# Patient Record
Sex: Male | Born: 1960 | Race: White | Hispanic: No | Smoking: Never smoker
Health system: Southern US, Community
[De-identification: ages and names within clinical notes are randomized; demographics above are authoritative.]

## PROBLEM LIST (undated history)

## (undated) DIAGNOSIS — T7840XA Allergy, unspecified, initial encounter: Secondary | ICD-10-CM

## (undated) DIAGNOSIS — R569 Unspecified convulsions: Secondary | ICD-10-CM

## (undated) DIAGNOSIS — I10 Essential (primary) hypertension: Secondary | ICD-10-CM

## (undated) DIAGNOSIS — J45909 Unspecified asthma, uncomplicated: Secondary | ICD-10-CM

## (undated) HISTORY — DX: Unspecified asthma, uncomplicated: J45.909

## (undated) HISTORY — DX: Allergy, unspecified, initial encounter: T78.40XA

## (undated) HISTORY — DX: Essential (primary) hypertension: I10

## (undated) HISTORY — PX: ANKLE ARTHROCENTESIS: SUR45

---

## 2000-01-21 ENCOUNTER — Encounter: Admission: RE | Admit: 2000-01-21 | Discharge: 2000-02-20 | Payer: Self-pay | Admitting: Orthopedic Surgery

## 2005-01-31 ENCOUNTER — Emergency Department (HOSPITAL_COMMUNITY): Admission: EM | Admit: 2005-01-31 | Discharge: 2005-01-31 | Payer: Self-pay | Admitting: Emergency Medicine

## 2005-03-03 ENCOUNTER — Emergency Department (HOSPITAL_COMMUNITY): Admission: EM | Admit: 2005-03-03 | Discharge: 2005-03-03 | Payer: Self-pay | Admitting: Family Medicine

## 2009-08-03 ENCOUNTER — Emergency Department: Payer: Self-pay | Admitting: Emergency Medicine

## 2012-07-02 ENCOUNTER — Emergency Department: Payer: Self-pay | Admitting: Emergency Medicine

## 2012-07-02 LAB — CBC WITH DIFFERENTIAL/PLATELET
Eosinophil %: 4.9 %
HCT: 49.1 % (ref 40.0–52.0)
HGB: 16.4 g/dL (ref 13.0–18.0)
Lymphocyte #: 1.4 10*3/uL (ref 1.0–3.6)
MCV: 92 fL (ref 80–100)
Monocyte %: 7.2 %
Platelet: 280 10*3/uL (ref 150–440)
RBC: 5.34 10*6/uL (ref 4.40–5.90)
WBC: 10.2 10*3/uL (ref 3.8–10.6)

## 2012-07-02 LAB — COMPREHENSIVE METABOLIC PANEL
Albumin: 3.6 g/dL (ref 3.4–5.0)
Alkaline Phosphatase: 80 U/L (ref 50–136)
Anion Gap: 8 (ref 7–16)
BUN: 8 mg/dL (ref 7–18)
Glucose: 120 mg/dL — ABNORMAL HIGH (ref 65–99)
Osmolality: 281 (ref 275–301)
Potassium: 3.9 mmol/L (ref 3.5–5.1)
Sodium: 141 mmol/L (ref 136–145)
Total Protein: 7.5 g/dL (ref 6.4–8.2)

## 2012-07-02 LAB — RAPID INFLUENZA A&B ANTIGENS

## 2016-12-23 ENCOUNTER — Ambulatory Visit: Payer: Self-pay | Admitting: Family Medicine

## 2016-12-23 VITALS — BP 124/80 | HR 80 | Wt 195.6 lb

## 2016-12-23 DIAGNOSIS — I10 Essential (primary) hypertension: Secondary | ICD-10-CM

## 2016-12-23 DIAGNOSIS — J301 Allergic rhinitis due to pollen: Secondary | ICD-10-CM

## 2016-12-23 DIAGNOSIS — J4540 Moderate persistent asthma, uncomplicated: Secondary | ICD-10-CM

## 2016-12-23 MED ORDER — BUDESONIDE-FORMOTEROL FUMARATE 160-4.5 MCG/ACT IN AERO: 2.0000 | INHALATION_SPRAY | Freq: Two times a day (BID) | RESPIRATORY_TRACT | 3 refills | Status: DC

## 2016-12-23 MED ORDER — ALBUTEROL SULFATE HFA 108 (90 BASE) MCG/ACT IN AERS: 2.0000 | INHALATION_SPRAY | Freq: Four times a day (QID) | RESPIRATORY_TRACT | 5 refills | Status: DC | PRN

## 2016-12-23 MED ORDER — HYDROCHLOROTHIAZIDE 25 MG PO TABS: 25.0000 mg | ORAL_TABLET | Freq: Every day | ORAL | 4 refills | Status: DC

## 2016-12-23 MED ORDER — LISINOPRIL 20 MG PO TABS: 20.0000 mg | ORAL_TABLET | Freq: Every day | ORAL | 2 refills | Status: DC

## 2016-12-23 MED ORDER — MONTELUKAST SODIUM 10 MG PO TABS: 10.0000 mg | ORAL_TABLET | Freq: Every day | ORAL | 3 refills | Status: DC

## 2016-12-23 NOTE — Progress Notes (Signed)
Primary Care Progress Note  Patient: Tony Calderon Male    DOB: May 04, 1961   56 y.o.   MRN: [id number] Visit Date: 12/23/2016  Today's Provider: Mila Merry, MD  Chief Complaint  Patient presents with  . Asthma    would like to start Symbicort again  . Hypertension  . Gastroesophageal Reflux   Subjective:    HPI  Patient is here to establish today. Recently released after being incarcerated for 1 1/2 years. Seeking employment. Long history of asthma since childhood and usually on inhalers. Had previously been on Symbicort. Tried QVar and Azmanex.   Also with history htn on lisinopril and hctz for several years. He reports he had incident of heart racing when he was first incarcerated and had complete cardiac workup at [facility] which he would like to follow up on.     No Known Allergies Previous Medications   ALBUTEROL (PROVENTIL HFA;VENTOLIN HFA) 108 (90 BASE) MCG/ACT INHALER    Inhale 2 puffs into the lungs every 6 (six) hours as needed for wheezing or shortness of breath.   DIPHENHYDRAMINE (BENADRYL) 25 MG CAPSULE    Take 25 mg by mouth once.   HYDROCHLOROTHIAZIDE (HYDRODIURIL) 25 MG TABLET    Take 25 mg by mouth daily.   LISINOPRIL (PRINIVIL,ZESTRIL) 20 MG TABLET    Take 20 mg by mouth daily.   LORATADINE (CLARITIN) 10 MG TABLET    Take 10 mg by mouth daily.    Review of Systems  Social History  Substance Use Topics  . Smoking status: Never Smoker  . Smokeless tobacco: Not on file  . Alcohol use Yes     Comment: occassionally   Objective:   BP 124/80   Pulse 80   Wt 195 lb 9.6 oz (88.7 kg)   Physical Exam  . General Appearance:    Alert, cooperative, no distress  Eyes:    PERRL, conjunctiva/corneas clear, EOM's intact       Lungs:     Clear to auscultation bilaterally, respirations unlabored  Heart:    Regular rate and rhythm  Neurologic:   Awake, alert, oriented x 3. No apparent focal neurological           defect.           Assessment & Plan:     1.  Moderate persistent asthma without complication Had previously been well controlled on Symbicort which was restarted - budesonide-formoterol (SYMBICORT) 160-4.5 MCG/ACT inhaler; Inhale 2 puffs into the lungs 2 (two) times daily.  Dispense: 3 Inhaler; Refill: 3 - montelukast (SINGULAIR) 10 MG tablet; Take 1 tablet (10 mg total) by mouth at bedtime.  Dispense: 90 tablet; Refill: 3 - albuterol (PROVENTIL HFA;VENTOLIN HFA) 108 (90 Base) MCG/ACT inhaler; Inhale 2 puffs into the lungs every 6 (six) hours as needed for wheezing or shortness of breath.  Dispense: 3 Inhaler; Refill: 5  2. Hypertension, unspecified type Controlled for prolonged period on  - lisinopril (PRINIVIL,ZESTRIL) 20 MG tablet; Take 1 tablet (20 mg total) by mouth daily.  Dispense: 90 tablet; Refill: 2 - hydrochlorothiazide (HYDRODIURIL) 25 MG tablet; Take 1 tablet (25 mg total) by mouth daily.  Dispense: 90 tablet; Refill: 4  3. Allergic rhinitis due to pollen, unspecified seasonality Previous taken Singulair which he states was effective.  - montelukast (SINGULAIR) 10 MG tablet; Take 1 tablet (10 mg total) by mouth at bedtime.  Dispense: 90 tablet; Refill: 3  Send for records from [facility] regarding previous cardiac work up.  Return in about 3 months (around 03/25/2017).       Mila Merryonald Julieann Drummonds, MD

## 2016-12-26 ENCOUNTER — Ambulatory Visit: Payer: Self-pay | Admitting: Pharmacy Technician

## 2016-12-26 DIAGNOSIS — Z79899 Other long term (current) drug therapy: Secondary | ICD-10-CM

## 2016-12-26 NOTE — Progress Notes (Signed)
  Completed contract.  Patient agreed to all terms of the [facility] contract.  Patient to provide notarized letter of support.   Provided patient with Community education officercommunity resource material based on his particular needs.    Symbicort & Ventolin Prescription Applications completed with patient.  Forwarded to [facility] for signature.  Upon receipt of signed applications from provider and proof of income from patient, Ventolin Prescription Application will be submitted to [employer] and Symbicort Prescription Application will be submitted to [place].  Sherilyn DacostaBetty J. Vaun Hyndman Care Manager [facility]

## 2017-01-03 ENCOUNTER — Emergency Department: Payer: Self-pay

## 2017-01-03 ENCOUNTER — Encounter: Payer: Self-pay | Admitting: Internal Medicine

## 2017-01-03 ENCOUNTER — Inpatient Hospital Stay
Admission: EM | Admit: 2017-01-03 | Discharge: 2017-01-04 | DRG: 193 | Disposition: A | Discharge status: Home / Self Care | Payer: Self-pay | Attending: Internal Medicine | Admitting: Internal Medicine

## 2017-01-03 DIAGNOSIS — J181 Lobar pneumonia, unspecified organism: Secondary | ICD-10-CM

## 2017-01-03 DIAGNOSIS — J189 Pneumonia, unspecified organism: Principal | ICD-10-CM | POA: Diagnosis present

## 2017-01-03 DIAGNOSIS — J440 Chronic obstructive pulmonary disease with acute lower respiratory infection: Secondary | ICD-10-CM | POA: Diagnosis present

## 2017-01-03 DIAGNOSIS — J40 Bronchitis, not specified as acute or chronic: Secondary | ICD-10-CM

## 2017-01-03 DIAGNOSIS — J9601 Acute respiratory failure with hypoxia: Secondary | ICD-10-CM | POA: Diagnosis present

## 2017-01-03 DIAGNOSIS — Z7952 Long term (current) use of systemic steroids: Secondary | ICD-10-CM

## 2017-01-03 DIAGNOSIS — I10 Essential (primary) hypertension: Secondary | ICD-10-CM | POA: Diagnosis present

## 2017-01-03 DIAGNOSIS — J441 Chronic obstructive pulmonary disease with (acute) exacerbation: Secondary | ICD-10-CM | POA: Diagnosis present

## 2017-01-03 DIAGNOSIS — J683 Other acute and subacute respiratory conditions due to chemicals, gases, fumes and vapors: Secondary | ICD-10-CM

## 2017-01-03 DIAGNOSIS — E876 Hypokalemia: Secondary | ICD-10-CM | POA: Diagnosis present

## 2017-01-03 DIAGNOSIS — Z7951 Long term (current) use of inhaled steroids: Secondary | ICD-10-CM

## 2017-01-03 DIAGNOSIS — J45901 Unspecified asthma with (acute) exacerbation: Secondary | ICD-10-CM | POA: Diagnosis present

## 2017-01-03 DIAGNOSIS — Z590 Homelessness: Secondary | ICD-10-CM

## 2017-01-03 LAB — CBC WITH DIFFERENTIAL/PLATELET
BASOS PCT: 0 %
Basophils Absolute: 0 10*3/uL (ref 0–0.1)
Eosinophils Absolute: 0.4 10*3/uL (ref 0–0.7)
Eosinophils Relative: 4 %
HEMATOCRIT: 38.3 % — AB (ref 40.0–52.0)
Hemoglobin: 13.2 g/dL (ref 13.0–18.0)
Lymphocytes Relative: 17 %
Lymphs Abs: 1.8 10*3/uL (ref 1.0–3.6)
MCH: 31.4 pg (ref 26.0–34.0)
MCHC: 34.5 g/dL (ref 32.0–36.0)
MCV: 91 fL (ref 80.0–100.0)
MONO ABS: 1 10*3/uL (ref 0.2–1.0)
MONOS PCT: 10 %
NEUTROS ABS: 7.4 10*3/uL — AB (ref 1.4–6.5)
Neutrophils Relative %: 69 %
Platelets: 275 10*3/uL (ref 150–440)
RBC: 4.21 MIL/uL — ABNORMAL LOW (ref 4.40–5.90)
RDW: 14.9 % — AB (ref 11.5–14.5)
WBC: 10.6 10*3/uL (ref 3.8–10.6)

## 2017-01-03 LAB — COMPREHENSIVE METABOLIC PANEL
ALT: 22 U/L (ref 17–63)
AST: 33 U/L (ref 15–41)
Albumin: 4 g/dL (ref 3.5–5.0)
Alkaline Phosphatase: 59 U/L (ref 38–126)
Anion gap: 7 (ref 5–15)
BILIRUBIN TOTAL: 0.4 mg/dL (ref 0.3–1.2)
BUN: 10 mg/dL (ref 6–20)
CHLORIDE: 94 mmol/L — AB (ref 101–111)
CO2: 32 mmol/L (ref 22–32)
CREATININE: 0.97 mg/dL (ref 0.61–1.24)
Calcium: 8.4 mg/dL — ABNORMAL LOW (ref 8.9–10.3)
GFR calc Af Amer: >60 mL/min (ref >60)
Glucose, Bld: 116 mg/dL — ABNORMAL HIGH (ref 65–99)
Potassium: 3.2 mmol/L — ABNORMAL LOW (ref 3.5–5.1)
Sodium: 133 mmol/L — ABNORMAL LOW (ref 135–145)
TOTAL PROTEIN: 7.5 g/dL (ref 6.5–8.1)

## 2017-01-03 LAB — MRSA PCR SCREENING: MRSA by PCR: NEGATIVE

## 2017-01-03 LAB — TROPONIN I: Troponin I: <0.03 ng/mL (ref <0.03)

## 2017-01-03 LAB — BRAIN NATRIURETIC PEPTIDE: B NATRIURETIC PEPTIDE 5: 15 pg/mL (ref 0.0–100.0)

## 2017-01-03 MED ORDER — LEVOFLOXACIN IN D5W 750 MG/150ML IV SOLN: 750.0000 mg | Freq: Once | INTRAVENOUS | Status: DC

## 2017-01-03 MED ORDER — ALBUTEROL SULFATE (2.5 MG/3ML) 0.083% IN NEBU
5.0000 mg | INHALATION_SOLUTION | Freq: Once | RESPIRATORY_TRACT | Status: AC
  Administered 2017-01-03: 5 mg via RESPIRATORY_TRACT
  Filled 2017-01-03: qty 6

## 2017-01-03 MED ORDER — GUAIFENESIN-DM 100-10 MG/5ML PO SYRP
5.0000 mL | ORAL_SOLUTION | ORAL | Status: DC | PRN
  Administered 2017-01-03 – 2017-01-04 (×2): 5 mL via ORAL
  Filled 2017-01-03 (×2): qty 5

## 2017-01-03 MED ORDER — LEVOFLOXACIN IN D5W 750 MG/150ML IV SOLN: 750.0000 mg | INTRAVENOUS | Status: DC

## 2017-01-03 MED ORDER — ACETAMINOPHEN 650 MG RE SUPP: 650.0000 mg | Freq: Four times a day (QID) | RECTAL | Status: DC | PRN

## 2017-01-03 MED ORDER — ALBUTEROL SULFATE (2.5 MG/3ML) 0.083% IN NEBU
2.5000 mg | INHALATION_SOLUTION | RESPIRATORY_TRACT | Status: DC | PRN
  Administered 2017-01-03: 2.5 mg via RESPIRATORY_TRACT
  Filled 2017-01-03: qty 3

## 2017-01-03 MED ORDER — CEFTRIAXONE SODIUM IN DEXTROSE 20 MG/ML IV SOLN: 1.0000 g | Freq: Once | INTRAVENOUS | Status: DC

## 2017-01-03 MED ORDER — IPRATROPIUM-ALBUTEROL 0.5-2.5 (3) MG/3ML IN SOLN
3.0000 mL | RESPIRATORY_TRACT | Status: DC
  Administered 2017-01-03 – 2017-01-04 (×6): 3 mL via RESPIRATORY_TRACT
  Filled 2017-01-03 (×6): qty 3

## 2017-01-03 MED ORDER — ONDANSETRON HCL 4 MG/2ML IJ SOLN: 4.0000 mg | Freq: Four times a day (QID) | INTRAMUSCULAR | Status: DC | PRN

## 2017-01-03 MED ORDER — IPRATROPIUM-ALBUTEROL 0.5-2.5 (3) MG/3ML IN SOLN
3.0000 mL | Freq: Once | RESPIRATORY_TRACT | Status: AC
  Administered 2017-01-03: 3 mL via RESPIRATORY_TRACT
  Filled 2017-01-03: qty 3

## 2017-01-03 MED ORDER — AZITHROMYCIN 250 MG PO TABS: ORAL_TABLET | ORAL | 0 refills | Status: AC

## 2017-01-03 MED ORDER — LEVOFLOXACIN IN D5W 750 MG/150ML IV SOLN
750.0000 mg | INTRAVENOUS | Status: DC
  Administered 2017-01-03: 750 mg via INTRAVENOUS
  Filled 2017-01-03 (×2): qty 150

## 2017-01-03 MED ORDER — METHYLPREDNISOLONE SODIUM SUCC 125 MG IJ SOLR
125.0000 mg | Freq: Once | INTRAMUSCULAR | Status: AC
  Administered 2017-01-03: 125 mg via INTRAVENOUS
  Filled 2017-01-03: qty 2

## 2017-01-03 MED ORDER — POTASSIUM CHLORIDE CRYS ER 20 MEQ PO TBCR
40.0000 meq | EXTENDED_RELEASE_TABLET | Freq: Once | ORAL | Status: AC
  Administered 2017-01-03: 40 meq via ORAL
  Filled 2017-01-03: qty 2

## 2017-01-03 MED ORDER — MONTELUKAST SODIUM 10 MG PO TABS
10.0000 mg | ORAL_TABLET | Freq: Every day | ORAL | Status: DC
  Administered 2017-01-03: 21:00:00 10 mg via ORAL
  Filled 2017-01-03: qty 1

## 2017-01-03 MED ORDER — ACETAMINOPHEN 325 MG PO TABS: 650.0000 mg | ORAL_TABLET | Freq: Four times a day (QID) | ORAL | Status: DC | PRN

## 2017-01-03 MED ORDER — POLYETHYLENE GLYCOL 3350 17 G PO PACK: 17.0000 g | PACK | Freq: Every day | ORAL | Status: DC | PRN

## 2017-01-03 MED ORDER — METHYLPREDNISOLONE SODIUM SUCC 125 MG IJ SOLR
60.0000 mg | Freq: Two times a day (BID) | INTRAMUSCULAR | Status: DC
  Administered 2017-01-03 – 2017-01-04 (×2): 60 mg via INTRAVENOUS
  Filled 2017-01-03 (×2): qty 2

## 2017-01-03 MED ORDER — MOMETASONE FURO-FORMOTEROL FUM 100-5 MCG/ACT IN AERO
2.0000 | INHALATION_SPRAY | Freq: Two times a day (BID) | RESPIRATORY_TRACT | Status: DC
  Administered 2017-01-03 – 2017-01-04 (×3): 2 via RESPIRATORY_TRACT
  Filled 2017-01-03: qty 8.8

## 2017-01-03 MED ORDER — DEXTROSE 5 % IV SOLN: 500.0000 mg | Freq: Once | INTRAVENOUS | Status: DC

## 2017-01-03 MED ORDER — HYDROCHLOROTHIAZIDE 25 MG PO TABS
25.0000 mg | ORAL_TABLET | Freq: Every day | ORAL | Status: DC
  Administered 2017-01-03 – 2017-01-04 (×2): 25 mg via ORAL
  Filled 2017-01-03 (×2): qty 1

## 2017-01-03 MED ORDER — ONDANSETRON HCL 4 MG PO TABS: 4.0000 mg | ORAL_TABLET | Freq: Four times a day (QID) | ORAL | Status: DC | PRN

## 2017-01-03 MED ORDER — LISINOPRIL 20 MG PO TABS
20.0000 mg | ORAL_TABLET | Freq: Every day | ORAL | Status: DC
  Administered 2017-01-03 – 2017-01-04 (×2): 20 mg via ORAL
  Filled 2017-01-03 (×2): qty 1

## 2017-01-03 MED ORDER — PREDNISONE 20 MG PO TABS
20.0000 mg | ORAL_TABLET | Freq: Every day | ORAL | 0 refills | Status: AC
Start: 2017-01-03 — End: 2018-01-03

## 2017-01-03 MED ORDER — ALBUTEROL SULFATE HFA 108 (90 BASE) MCG/ACT IN AERS: 2.0000 | INHALATION_SPRAY | Freq: Four times a day (QID) | RESPIRATORY_TRACT | 2 refills | Status: DC | PRN

## 2017-01-03 MED ORDER — ENOXAPARIN SODIUM 40 MG/0.4ML ~~LOC~~ SOLN
40.0000 mg | SUBCUTANEOUS | Status: DC
  Filled 2017-01-03: qty 0.4

## 2017-01-03 MED ORDER — IBUPROFEN 400 MG PO TABS: 400.0000 mg | ORAL_TABLET | Freq: Four times a day (QID) | ORAL | Status: DC | PRN

## 2017-01-03 NOTE — ED Triage Notes (Signed)
Pt reports increased shortness of breath and orthopnea x 2 weeks. Pt has hx of asthma. Pt presents w/ c/o dyspnea, has audible wheezing and labored breathing at rest.

## 2017-01-03 NOTE — ED Provider Notes (Addendum)
[facility] Emergency Department Provider Note   ____________________________________________   First MD Initiated Contact with Patient 01/03/17 0507     (approximate)  I have reviewed the triage vital signs and the nursing notes.   HISTORY  Chief Complaint Asthma and Wheezing   HPI Tony Calderon is a 56 y.o. male who reportedly is homeless. He's been coughing and short of breath for about 2 weeks. May have had a fever he's not sure. He is coughing up black material. He says he has been having trouble laying down flat because he gets more short of breath.  Past Medical History:  Diagnosis Date  . Allergy   . Asthma   . Hypertension     There are no active problems to display for this patient.   Past Surgical History:  Procedure Laterality Date  . ANKLE ARTHROCENTESIS Right     Prior to Admission medications   Medication Sig Start Date End Date Taking? Authorizing Provider  albuterol (PROVENTIL HFA;VENTOLIN HFA) 108 (90 Base) MCG/ACT inhaler Inhale 2 puffs into the lungs every 6 (six) hours as needed for wheezing or shortness of breath. 12/23/16   Malva LimesFisher, Donald E, MD  albuterol (PROVENTIL HFA;VENTOLIN HFA) 108 (90 Base) MCG/ACT inhaler Inhale 2 puffs into the lungs every 6 (six) hours as needed for wheezing or shortness of breath. 01/03/17   Arnaldo NatalMalinda, Deliana Avalos F, MD  azithromycin (ZITHROMAX Z-PAK) 250 MG tablet Take 2 tablets (500 mg) on  Day 1,  followed by 1 tablet (250 mg) once daily on Days 2 through 5. 01/03/17 01/08/17  Arnaldo NatalMalinda, Kriss Ishler F, MD  budesonide-formoterol [facility](SYMBICORT) 160-4.5 MCG/ACT inhaler Inhale 2 puffs into the lungs 2 (two) times daily. 12/23/16   Malva LimesFisher, Donald E, MD  diphenhydrAMINE (BENADRYL) 25 mg capsule Take 25 mg by mouth once.    [provider]  hydrochlorothiazide (HYDRODIURIL) 25 MG tablet Take 1 tablet (25 mg total) by mouth daily. 12/23/16   Malva LimesFisher, Donald E, MD  lisinopril (PRINIVIL,ZESTRIL) 20 MG tablet Take 1  tablet (20 mg total) by mouth daily. 12/23/16   Malva LimesFisher, Donald E, MD  loratadine (CLARITIN) 10 MG tablet Take 10 mg by mouth daily.    [provider]  montelukast (SINGULAIR) 10 MG tablet Take 1 tablet (10 mg total) by mouth at bedtime. 12/23/16   Malva LimesFisher, Donald E, MD  predniSONE (DELTASONE) 20 MG tablet Take 1 tablet (20 mg total) by mouth daily. 01/03/17 01/03/18  Arnaldo NatalMalinda, Jary Louvier F, MD    Allergies Patient has no known allergies.  Family History  Problem Relation Age of Onset  . Cancer Mother   . Hypertension Father   . Heart disease Father     Social History Social History  Substance Use Topics  . Smoking status: Never Smoker  . Smokeless tobacco: Not on file  . Alcohol use Yes     Comment: occassionally    Review of Systems  Constitutional: Possible fever/chills Eyes: No visual changes. ENT: No sore throat. Cardiovascular: Denies chest pain. Respiratory:  shortness of breath. Gastrointestinal: No abdominal pain.  No nausea, no vomiting.  No diarrhea.  No constipation. Genitourinary: Negative for dysuria. Musculoskeletal: Negative for back pain. Skin: Negative for rash. Neurological: Negative for headaches, focal weakness or numbness. \ ____________________________________________   PHYSICAL EXAM:  VITAL SIGNS: ED Triage Vitals  Enc Vitals Group     BP 01/03/17 0448 105/61     Pulse Rate 01/03/17 0448 96     Resp 01/03/17 0448 (!)  28     Temp 01/03/17 0448 98.9 F (37.2 C)     Temp Source 01/03/17 0448 Oral     SpO2 01/03/17 0448 92 %     Weight 01/03/17 0449 195 lb (88.5 kg)     Height 01/03/17 0449 5\' 9"  (1.753 m)     Head Circumference --      Peak Flow --      Pain Score 01/03/17 0446 8     Pain Loc --      Pain Edu? --      Excl. in GC? --     Constitutional: Alert and oriented. Well appearing and in no acute distress. Eyes: Conjunctivae are normal. PERRL. EOMI. Head: Atraumatic. Nose: No congestion/rhinnorhea. Mouth/Throat: Mucous  membranes are moist.  Oropharynx non-erythematous. Neck: No stridor. Cardiovascular: Normal rate, regular rhythm. Grossly normal heart sounds.  Good peripheral circulation. Respiratory: Normal respiratory effort.  No retractions. Lungs wheezes and crackles diffusely Gastrointestinal: Soft and nontender. No distention. No abdominal bruits. No CVA tenderness. Musculoskeletal: No lower extremity tenderness nor edema.  No joint effusions. Neurologic:  Normal speech and language. No gross focal neurologic deficits are appreciated.  Skin:  Skin is warm, dry and intact. No rash noted. Psychiatric: Mood and affect are normal. Speech and behavior are normal.  ____________________________________________   LABS (all labs ordered are listed, but only abnormal results are displayed)  Labs Reviewed  COMPREHENSIVE METABOLIC PANEL - Abnormal; Notable for the following:       Result Value   Sodium 133 (*)    Potassium 3.2 (*)    Chloride 94 (*)    Glucose, Bld 116 (*)    Calcium 8.4 (*)    All other components within normal limits  CBC WITH DIFFERENTIAL/PLATELET - Abnormal; Notable for the following:    RBC 4.21 (*)    HCT 38.3 (*)    RDW 14.9 (*)    Neutro Abs 7.4 (*)    All other components within normal limits  BRAIN NATRIURETIC PEPTIDE  TROPONIN I   ____________________________________________  EKG EKG read and interpreted by me shows normal sinus rhythm rate of 94 normal axis left bundle-branch block no apparent acute ST-T wave changes bundle-branch block is new since November 2013 ____________________________________________  RADIOLOGY  IMPRESSION: Peribronchial thickening. Minimal right basilar airspace opacity may reflect atelectasis or possibly mild infection.   Electronically Signed   By: Roanna Raider M.D.   On: 01/03/2017 05:59 ____________________________________________   PROCEDURES  Procedure(s) performed:  Procedures  Critical Care performed:    ____________________________________________   INITIAL IMPRESSION / ASSESSMENT AND PLAN / ED COURSE  Pertinent labs & imaging results that were available during my care of the patient were reviewed by me and considered in my medical decision making (see chart for details).   Patient continues to have wheezes and crackles even after nebs and site Medrol. I get him up and move him around and he drops down to 87 rapidly and stays there for a while. He does have a small infiltrate on x-ray we will admit him for bronchitis pneumonia reactive airways.  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   AZITHROMYCIN (ZITHROMAX Z-PAK) 250 MG TABLET    Take 2 tablets (500 mg) on  Day 1,  followed by 1 tablet (250 mg) once daily on Days 2 through 5.   PREDNISONE (DELTASONE) 20 MG TABLET    Take 1 tablet (20 mg total) by mouth daily.     Note:  This document was prepared using Dragon voice recognition software and may include unintentional dictation errors.    Arnaldo Natal, MD 01/03/17 Darrel Hoover    Arnaldo Natal, MD 01/03/17 [phone]

## 2017-01-03 NOTE — ED Notes (Signed)
Delay in report due to RN in critical room.

## 2017-01-03 NOTE — H&P (Signed)
[facility] [place] [facility]   PATIENT NAME: Tony Calderon    MR#:  [id number]  DATE OF BIRTH:  Dec 13, 1960  DATE OF ADMISSION:  01/03/2017  PRIMARY CARE PHYSICIAN: Patient, No Pcp Per   REQUESTING/REFERRING PHYSICIAN: Dr. Darnelle Catalan  CHIEF COMPLAINT:   Chief Complaint  Patient presents with  . Asthma  . Wheezing    HISTORY OF PRESENT ILLNESS:  Cloyce Blankenhorn  is a 56 y.o. male with a known history of Asthma, hypertension presents to the emergency room complaining of worsening shortness of breath, wheezing and not feeling well. Patient was seen in urgent care recently. Prescriptions given for inhalers but patient could not obtain these due to lack of insurance. Today his saturations are normal at room air but with minimal activity did drop to 87% on room air. He does not use oxygen at home. Continues to wheeze in spite of IV steroids and multiple nebulizer treatments in the emergency room. Chest x-ray shows right lower lobe pneumonia. Does not smoke. No sick contacts.  PAST MEDICAL HISTORY:   Past Medical History:  Diagnosis Date  . Allergy   . Asthma   . Hypertension     PAST SURGICAL HISTORY:   Past Surgical History:  Procedure Laterality Date  . ANKLE ARTHROCENTESIS Right     SOCIAL HISTORY:   Social History  Substance Use Topics  . Smoking status: Never Smoker  . Smokeless tobacco: Not on file  . Alcohol use Yes     Comment: occassionally    FAMILY HISTORY:   Family History  Problem Relation Age of Onset  . Cancer Mother   . Hypertension Father   . Heart disease Father     DRUG ALLERGIES:  No Known Allergies  REVIEW OF SYSTEMS:   Review of Systems  Constitutional: Positive for malaise/fatigue. Negative for chills and fever.  HENT: Negative for sore throat.   Eyes: Negative for blurred vision, double vision and pain.  Respiratory: Positive for cough, sputum production, shortness of breath and wheezing. Negative for hemoptysis.    Cardiovascular: Negative for chest pain, palpitations, orthopnea and leg swelling.  Gastrointestinal: Negative for abdominal pain, constipation, diarrhea, heartburn, nausea and vomiting.  Genitourinary: Negative for dysuria and hematuria.  Musculoskeletal: Negative for back pain and joint pain.  Skin: Negative for rash.  Neurological: Positive for weakness. Negative for sensory change, speech change, focal weakness and headaches.  Endo/Heme/Allergies: Does not bruise/bleed easily.  Psychiatric/Behavioral: Negative for depression. The patient is not nervous/anxious.     MEDICATIONS AT HOME:   Prior to Admission medications   Medication Sig Start Date End Date Taking? Authorizing Provider  albuterol (PROVENTIL HFA;VENTOLIN HFA) 108 (90 Base) MCG/ACT inhaler Inhale 2 puffs into the lungs every 6 (six) hours as needed for wheezing or shortness of breath. 12/23/16  Yes Malva Limes, MD  diphenhydrAMINE (BENADRYL) 25 mg capsule Take 25 mg by mouth every 6 (six) hours as needed.    Yes [provider]  hydrochlorothiazide (HYDRODIURIL) 25 MG tablet Take 1 tablet (25 mg total) by mouth daily. 12/23/16  Yes Malva Limes, MD  lisinopril (PRINIVIL,ZESTRIL) 20 MG tablet Take 1 tablet (20 mg total) by mouth daily. 12/23/16  Yes Malva Limes, MD  loratadine (CLARITIN) 10 MG tablet Take 10 mg by mouth daily.   Yes [provider]  montelukast (SINGULAIR) 10 MG tablet Take 1 tablet (10 mg total) by mouth at bedtime. 12/23/16  Yes Malva Limes, MD  Multiple Vitamin (  MULTIVITAMIN) tablet Take 1 tablet by mouth daily.   Yes [provider]  albuterol (PROVENTIL HFA;VENTOLIN HFA) 108 (90 Base) MCG/ACT inhaler Inhale 2 puffs into the lungs every 6 (six) hours as needed for wheezing or shortness of breath. 01/03/17   Arnaldo Natal, MD  azithromycin (ZITHROMAX Z-PAK) 250 MG tablet Take 2 tablets (500 mg) on  Day 1,  followed by 1 tablet (250 mg) once daily on Days 2 through 5.  01/03/17 01/08/17  Arnaldo Natal, MD  budesonide-formoterol [facility]) 160-4.5 MCG/ACT inhaler Inhale 2 puffs into the lungs 2 (two) times daily. Patient not taking: Reported on 01/03/2017 12/23/16   Malva Limes, MD  predniSONE (DELTASONE) 20 MG tablet Take 1 tablet (20 mg total) by mouth daily. 01/03/17 01/03/18  Arnaldo Natal, MD     VITAL SIGNS:  Blood pressure 123/61, pulse 93, temperature 98.9 F (37.2 C), temperature source Oral, resp. rate 17, height 5\' 9"  (1.753 m), weight 88.5 kg (195 lb), SpO2 99 %.  PHYSICAL EXAMINATION:  Physical Exam  GENERAL:  56 y.o.-year-old patient lying in the bed with Conversational dyspnea EYES: Pupils equal, round, reactive to light and accommodation. No scleral icterus. Extraocular muscles intact.  HEENT: Head atraumatic, normocephalic. Oropharynx and nasopharynx clear. No oropharyngeal erythema, moist oral mucosa  NECK:  Supple, no jugular venous distention. No thyroid enlargement, no tenderness.  LUNGS: Bilateral wheezing with poor air entry. CARDIOVASCULAR: S1, S2 normal. No murmurs, rubs, or gallops.  ABDOMEN: Soft, nontender, nondistended. Bowel sounds present. No organomegaly or mass.  EXTREMITIES: No pedal edema, cyanosis, or clubbing. + 2 pedal & radial pulses b/l.   NEUROLOGIC: Cranial nerves II through XII are intact. No focal Motor or sensory deficits appreciated b/l PSYCHIATRIC: The patient is alert and oriented x 3. Good affect.  SKIN: No obvious rash, lesion, or ulcer.   LABORATORY PANEL:   CBC  Recent Labs Lab 01/03/17 0516  WBC 10.6  HGB 13.2  HCT 38.3*  PLT 275   ------------------------------------------------------------------------------------------------------------------  Chemistries   Recent Labs Lab 01/03/17 0516  NA 133*  K 3.2*  CL 94*  CO2 32  GLUCOSE 116*  BUN 10  CREATININE 0.97  CALCIUM 8.4*  AST 33  ALT 22  ALKPHOS 59  BILITOT 0.4    ------------------------------------------------------------------------------------------------------------------  Cardiac Enzymes  Recent Labs Lab 01/03/17 0516  TROPONINI <0.03   ------------------------------------------------------------------------------------------------------------------  RADIOLOGY:  Dg Chest 2 View  Result Date: 01/03/2017 CLINICAL DATA:  Subacute onset of shortness of breath and orthopnea. Initial encounter. EXAM: CHEST  2 VIEW COMPARISON:  Chest radiograph performed 07/02/2012 FINDINGS: The lungs are well-aerated. Peribronchial thickening is noted. Minimal right basilar airspace opacity may reflect atelectasis or possibly mild infection. There is no evidence of pleural effusion or pneumothorax. The heart is normal in size; the mediastinal contour is within normal limits. No acute osseous abnormalities are seen. IMPRESSION: Peribronchial thickening. Minimal right basilar airspace opacity may reflect atelectasis or possibly mild infection. Electronically Signed   By: Roanna Raider M.D.   On: 01/03/2017 05:59     IMPRESSION AND PLAN:   * Acute asthma exacerbation with RLL pneumonia and acute Hypoxic respiratory failure -IV steroids, Antibiotics - Scheduled Nebulizers - Inhalers -Wean O2 as tolerated - Consult pulmonary if no improvement  * Hypertension. Continue home medications. IV when necessary medications  * Hypokalemia. We'll replace orally.  * DVT prophylaxis with Lovenox  All the records are reviewed and case discussed with ED provider. Management plans discussed with the  patient, family and they are in agreement.  CODE STATUS: FULL CODE  TOTAL TIME TAKING CARE OF THIS PATIENT: 40 minutes.   Milagros LollSudini, Brionne Mertz R M.D on 01/03/2017 at 9:29 AM  Between 7am to 6pm - Pager - [phone]  After 6pm go to [email or web address] - password [email or web address]  [facility]  Office  [phone]  CC: Primary care physician; Patient, No Pcp  Per  Note: This dictation was prepared with Dragon dictation along with smaller phrase technology. Any transcriptional errors that result from this process are unintentional.

## 2017-01-03 NOTE — Progress Notes (Signed)
Pharmacy Antibiotic Note  Aretta NipWilliam E Bagdasarian III is a 56 y.o. male admitted on 01/03/2017 with pneumonia.  Pharmacy has been consulted for levofloxacin dosing.  Plan: Begin levofloxacin 750 mg IV q 24 hours. Recommend duration of 5 days of therapy  Height: 5\' 9"  (175.3 cm) Weight: 195 lb (88.5 kg) IBW/kg (Calculated) : 70.7  Temp (24hrs), Avg:98.9 F (37.2 C), Min:98.9 F (37.2 C), Max:98.9 F (37.2 C)   Recent Labs Lab 01/03/17 0516  WBC 10.6  CREATININE 0.97    Estimated Creatinine Clearance: 96.9 mL/min (by C-G formula based on SCr of 0.97 mg/dL).    No Known Allergies  Antimicrobials this admission: levofloxacin 6/2 >>   Dose adjustments this admission:  Microbiology results: 6/2 BCx: sent  UCx:    Sputum:    MRSA PCR:   Thank you for allowing pharmacy to be a part of this patient's care.  Horris LatinoHolly Gilliam, PharmD Pharmacy Resident 01/03/2017 10:23 AM

## 2017-01-04 LAB — BASIC METABOLIC PANEL
Anion gap: 8 (ref 5–15)
BUN: 20 mg/dL (ref 6–20)
CHLORIDE: 95 mmol/L — AB (ref 101–111)
CO2: 27 mmol/L (ref 22–32)
CREATININE: 0.86 mg/dL (ref 0.61–1.24)
Calcium: 8.5 mg/dL — ABNORMAL LOW (ref 8.9–10.3)
Glucose, Bld: 203 mg/dL — ABNORMAL HIGH (ref 65–99)
Potassium: 3.3 mmol/L — ABNORMAL LOW (ref 3.5–5.1)
SODIUM: 130 mmol/L — AB (ref 135–145)

## 2017-01-04 MED ORDER — ALBUTEROL SULFATE (2.5 MG/3ML) 0.083% IN NEBU: 2.5000 mg | INHALATION_SOLUTION | Freq: Four times a day (QID) | RESPIRATORY_TRACT | 1 refills | Status: DC | PRN

## 2017-01-04 MED ORDER — BECLOMETHASONE DIPROPIONATE 40 MCG/ACT IN AERS: 1.0000 | INHALATION_SPRAY | Freq: Two times a day (BID) | RESPIRATORY_TRACT | 1 refills | Status: DC

## 2017-01-04 MED ORDER — LEVOFLOXACIN 750 MG PO TABS: 750.0000 mg | ORAL_TABLET | Freq: Every day | ORAL | Status: DC

## 2017-01-04 NOTE — Care Management Note (Addendum)
Case Management Note  Patient Details  Name: Tony Calderon MRN: [id number] Date of Birth: 11/19/1960  Subjective/Objective:    Discussed discharge planning with Tony Calderon. He is uninsured and a financial assistance evaluation was completed for [facility] who will provide a charity home nebulizer machine for Tony Calderon today in his hospital room. Tony Calderon [facility] on [address] [place]. Tony Calderon reports that he is currently residing with his sister after recently being released from prison. Tony Calderon was provided with a MATCH medication assistance form and given instruction about how to use it. [facility] tomorrow.                  Action/Plan:   Expected Discharge Date:    01/04/17             Expected Discharge Plan:   01/04/17  In-House Referral:     Discharge planning Services     Post Acute Care Choice:   Yes Choice offered to:   Patient  DME Arranged:   [facility] Nebulizer DME Agency:   [facility] Arranged:   NA HH Agency:   NA  Status of Service:   Completed  If discussed at Long Length of Stay Meetings, dates discussed:    Additional Comments:  Hibah Odonnell A, RN 01/04/2017, 11:48 AM

## 2017-01-04 NOTE — Progress Notes (Signed)
PHARMACIST - PHYSICIAN COMMUNICATION DR:   Lily KocherSudin CONCERNING: Antibiotic IV to Oral Route Change Policy  RECOMMENDATION: This patient is receiving Levofloxacin by the intravenous route.  Based on criteria approved by the Pharmacy and Therapeutics Committee, the antibiotic(s) is/are being converted to the equivalent oral dose form(s).   DESCRIPTION: These criteria include:  Patient being treated for a respiratory tract infection, urinary tract infection, cellulitis or clostridium difficile associated diarrhea if on metronidazole  The patient is not neutropenic and does not exhibit a GI malabsorption state  The patient is eating (either orally or via tube) and/or has been taking other orally administered medications for a least 24 hours  The patient is improving clinically and has a Tmax < 100.5  If you have questions about this conversion, please contact the Pharmacy Department  []   [phone]( [phone] )  [facility] []   [phone]( [phone] )  [facility] []   [phone]( [phone] )  [facility] []   [phone]( [phone] )  [facility] []   [phone]( [phone] )  [facility]

## 2017-01-05 LAB — HIV ANTIBODY (ROUTINE TESTING W REFLEX): HIV SCREEN 4TH GENERATION: NONREACTIVE

## 2017-01-05 NOTE — Discharge Summary (Signed)
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 Allergy   . Asthma   . Hypertension      ADMITTING HISTORY  HISTORY OF PRESENT ILLNESS:  Tony Calderon  is a 56 y.o. male with a known history of Asthma, hypertension presents to the emergency room complaining of worsening shortness of breath, wheezing and not feeling well. Patient was seen in urgent care recently. Prescriptions given for inhalers but patient could not obtain these due to lack of insurance. Today his saturations are normal at room air but with minimal activity did drop to 87% on room air. He does not use oxygen at home. Continues to wheeze in spite of IV steroids and multiple nebulizer treatments in the emergency room. Chest x-ray shows right lower lobe pneumonia. Does not smoke. No sick contacts.   HOSPITAL COURSE:   * Acute asthma exacerbation with RLL pneumonia and acute Hypoxic respiratory failure -IV steroids, Antibiotics - Scheduled Nebulizers - Inhalers -Weaned off oxygen. Saturations 96% on ambulation on room air. Patient improved well. By the day of discharge he has only mild expiratory wheezing. Feels back to normal with his breathing. He has been given a nebulizer along with prescription for albuterol. Patient is unable to afford most of his inhalers.  * Hypertension. Continue home medications.  * Hypokalemia. Replace orally and resolved  * DVT  prophylaxis with Lovenox in the hospital  Stable for discharge home.  CONSULTS OBTAINED:    DRUG ALLERGIES:  No Known Allergies  DISCHARGE MEDICATIONS:   Discharge Medication List as of 01/04/2017 12:51 PM    START taking these medications   Details  albuterol (PROVENTIL) (2.5 MG/3ML) 0.083% nebulizer solution Take 3 mLs (2.5 mg total) by nebulization every 6 (six) hours as needed for wheezing or shortness of breath., Starting Sun 01/04/2017, Print    azithromycin (ZITHROMAX Z-PAK) 250 MG tablet Take 2 tablets (500 mg) on  Day 1,  followed by 1 tablet (250 mg) once daily on Days 2 through 5., Print    beclomethasone (QVAR) 40 MCG/ACT inhaler Inhale 1 puff into the lungs 2 (two) times daily., Starting Sun 01/04/2017, Print    predniSONE (DELTASONE) 20 MG tablet Take 1 tablet (20 mg total) by mouth daily., Starting Sat 01/03/2017, Until Sun 01/03/2018, Print      CONTINUE these medications which have NOT CHANGED   Details  albuterol (PROVENTIL HFA;VENTOLIN HFA) 108 (90 Base) MCG/ACT inhaler Inhale 2 puffs into the lungs every 6 (six) hours as needed for wheezing or shortness of breath., Starting Tue 12/23/2016, Normal    diphenhydrAMINE (BENADRYL) 25 mg capsule Take 25 mg by mouth every 6 (six) hours as needed. , Historical Med    hydrochlorothiazide (HYDRODIURIL) 25 MG tablet Take 1 tablet (25 mg total) by mouth daily., Starting Tue 12/23/2016, Normal    lisinopril (PRINIVIL,ZESTRIL) 20 MG tablet Take 1 tablet (20 mg total) by mouth daily., Starting Tue 12/23/2016, Normal  loratadine (CLARITIN) 10 MG tablet Take 10 mg by mouth daily., Historical Med    montelukast (SINGULAIR) 10 MG tablet Take 1 tablet (10 mg total) by mouth at bedtime., Starting Tue 12/23/2016, Normal    Multiple Vitamin (MULTIVITAMIN) tablet Take 1 tablet by mouth daily., Historical Med    budesonide-formoterol (SYMBICORT) 160-4.5 MCG/ACT inhaler Inhale 2 puffs into the lungs 2 (two) times daily., Starting Tue  12/23/2016, Normal        Today   VITAL SIGNS:  Blood pressure 112/74, pulse (!) 101, temperature 97.6 F (36.4 C), temperature source Oral, resp. rate 20, height 5\' 9"  (1.753 m), weight 89 kg (196 lb 3.4 oz), SpO2 94 %.  I/O:  No intake or output data in the 24 hours ending 01/05/17 1333  PHYSICAL EXAMINATION:  Physical Exam  GENERAL:  56 y.o.-year-old patient lying in the bed with no acute distress.  LUNGS: Normal work of breathing. Mild expiratory wheezing. Good air entry. CARDIOVASCULAR: S1, S2 normal. No murmurs, rubs, or gallops.  ABDOMEN: Soft, non-tender, non-distended. Bowel sounds present. No organomegaly or mass.  NEUROLOGIC: Moves all 4 extremities. PSYCHIATRIC: The patient is alert and oriented x 3.  SKIN: No obvious rash, lesion, or ulcer.   DATA REVIEW:   CBC  Recent Labs Lab 01/03/17 0516  WBC 10.6  HGB 13.2  HCT 38.3*  PLT 275    Chemistries   Recent Labs Lab 01/03/17 0516 01/04/17 0554  NA 133* 130*  K 3.2* 3.3*  CL 94* 95*  CO2 32 27  GLUCOSE 116* 203*  BUN 10 20  CREATININE 0.97 0.86  CALCIUM 8.4* 8.5*  AST 33  --   ALT 22  --   ALKPHOS 59  --   BILITOT 0.4  --     Cardiac Enzymes  Recent Labs Lab 01/03/17 0516  TROPONINI <0.03    Microbiology Results  Results for orders placed or performed during the hospital encounter of 01/03/17  Culture, blood (routine x 2)     Status: None (Preliminary result)   Collection Time: 01/03/17 12:34 PM  Result Value Ref Range Status   Specimen Description BLOOD LEFT ANTECUBITAL  Final   Special Requests   Final    BOTTLES DRAWN AEROBIC AND ANAEROBIC Blood Culture adequate volume   Culture NO GROWTH 2 DAYS  Final   Report Status PENDING  Incomplete  MRSA PCR Screening     Status: None   Collection Time: 01/03/17 12:38 PM  Result Value Ref Range Status   MRSA by PCR NEGATIVE NEGATIVE Final    Comment:        The GeneXpert MRSA Assay (FDA approved for NASAL specimens only), is one  component of a comprehensive MRSA colonization surveillance program. It is not intended to diagnose MRSA infection nor to guide or monitor treatment for MRSA infections.   Culture, blood (routine x 2)     Status: None (Preliminary result)   Collection Time: 01/03/17 12:39 PM  Result Value Ref Range Status   Specimen Description BLOOD BLOOD LEFT HAND  Final   Special Requests   Final    BOTTLES DRAWN AEROBIC AND ANAEROBIC Blood Culture adequate volume   Culture NO GROWTH 2 DAYS  Final   Report Status PENDING  Incomplete    RADIOLOGY:  No results found.  Follow up with PCP in 1 week.  Management plans discussed with the patient, family and they are in agreement.  CODE STATUS:  Code Status History    Date Active  Date Inactive Code Status Order ID Comments User Context   01/03/2017  9:15 AM 01/04/2017  4:42 PM Full Code [id number]  Milagros LollSudini, Kadin Bera, MD ED      TOTAL TIME TAKING CARE OF THIS PATIENT ON DAY OF DISCHARGE: more than 30 minutes.   Milagros LollSudini, Onna Nodal R M.D on 01/05/2017 at 1:33 PM  Between 7am to 6pm - Pager - [phone]  After 6pm go to [email or web address] - password [email or web address]  [facility] [place] [facility]  Office  [phone]  CC: Primary care physician; Patient, No Pcp Per  Note: This dictation was prepared with Dragon dictation along with smaller phrase technology. Any transcriptional errors that result from this process are unintentional.

## 2017-01-06 ENCOUNTER — Telehealth: Payer: Self-pay | Admitting: Pharmacy Technician

## 2017-01-06 NOTE — Telephone Encounter (Signed)
Patient eligible to receive medication assistance at [facility] until 09/04/17 as long as eligibility requirements continue to be met.  Rosco Harriott J. Alaney Witter Care Manager [facility] 

## 2017-01-08 ENCOUNTER — Telehealth: Payer: Self-pay

## 2017-01-08 ENCOUNTER — Encounter: Payer: Self-pay | Admitting: Pharmacist

## 2017-01-08 LAB — CULTURE, BLOOD (ROUTINE X 2)
CULTURE: NO GROWTH
Culture: NO GROWTH
Special Requests: ADEQUATE
Special Requests: ADEQUATE

## 2017-01-08 NOTE — Telephone Encounter (Signed)
Received PAP application from [facility] for Ventolin HFA placed for provider to sign. 

## 2017-01-08 NOTE — Telephone Encounter (Signed)
Received PAP application from [facility] for Symbicort placed for provider to sign. 

## 2017-01-23 NOTE — Telephone Encounter (Signed)
Placed signed application/script in [facility] folder for pickup. 

## 2017-01-28 ENCOUNTER — Telehealth: Payer: Self-pay | Admitting: Pharmacist

## 2017-01-28 NOTE — Telephone Encounter (Signed)
01/28/17 Faxed [employer] application for Ventolin HFA 90mcg Inhale 2 puffs into the lungs every 6 hours as needed for wheezing or shortness of breath.AJ 01/18/2017 Faxed [place] application for Symbicort 160/4.385mcg Inhale 2 puffs into the lungs twice a day.Forde RadonAJ

## 2017-03-26 ENCOUNTER — Ambulatory Visit: Payer: Self-pay

## 2017-10-11 ENCOUNTER — Telehealth: Payer: Self-pay | Admitting: Pharmacy Technician

## 2017-10-11 NOTE — Telephone Encounter (Signed)
Patient failed to provide 2019 financial documentation.  No additional medication assistance will be provided by [facility] without the required proof of income documentation.  Patient notified by letter.  Arkie Tagliaferro J. Mariyana Fulop Care Manager [facility] 

## 2018-03-08 ENCOUNTER — Emergency Department
Admission: EM | Admit: 2018-03-08 | Discharge: 2018-03-08 | Disposition: A | Discharge status: Home / Self Care | Payer: Self-pay | Attending: Emergency Medicine | Admitting: Emergency Medicine

## 2018-03-08 ENCOUNTER — Emergency Department: Payer: Self-pay

## 2018-03-08 ENCOUNTER — Other Ambulatory Visit: Payer: Self-pay

## 2018-03-08 ENCOUNTER — Encounter: Payer: Self-pay | Admitting: Intensive Care

## 2018-03-08 DIAGNOSIS — Z79899 Other long term (current) drug therapy: Secondary | ICD-10-CM | POA: Insufficient documentation

## 2018-03-08 DIAGNOSIS — Y92002 Bathroom of unspecified non-institutional (private) residence single-family (private) house as the place of occurrence of the external cause: Secondary | ICD-10-CM | POA: Insufficient documentation

## 2018-03-08 DIAGNOSIS — R55 Syncope and collapse: Secondary | ICD-10-CM

## 2018-03-08 DIAGNOSIS — Y9389 Activity, other specified: Secondary | ICD-10-CM | POA: Insufficient documentation

## 2018-03-08 DIAGNOSIS — J449 Chronic obstructive pulmonary disease, unspecified: Secondary | ICD-10-CM | POA: Insufficient documentation

## 2018-03-08 DIAGNOSIS — S2231XA Fracture of one rib, right side, initial encounter for closed fracture: Secondary | ICD-10-CM | POA: Insufficient documentation

## 2018-03-08 DIAGNOSIS — I10 Essential (primary) hypertension: Secondary | ICD-10-CM | POA: Insufficient documentation

## 2018-03-08 DIAGNOSIS — Y999 Unspecified external cause status: Secondary | ICD-10-CM | POA: Insufficient documentation

## 2018-03-08 DIAGNOSIS — X509XXA Other and unspecified overexertion or strenuous movements or postures, initial encounter: Secondary | ICD-10-CM | POA: Insufficient documentation

## 2018-03-08 LAB — BASIC METABOLIC PANEL
Anion gap: 9 (ref 5–15)
BUN: 12 mg/dL (ref 6–20)
CALCIUM: 8.9 mg/dL (ref 8.9–10.3)
CO2: 27 mmol/L (ref 22–32)
CREATININE: 1.15 mg/dL (ref 0.61–1.24)
Chloride: 103 mmol/L (ref 98–111)
GFR calc non Af Amer: >60 mL/min (ref >60)
Glucose, Bld: 115 mg/dL — ABNORMAL HIGH (ref 70–99)
Potassium: 4.4 mmol/L (ref 3.5–5.1)
Sodium: 139 mmol/L (ref 135–145)

## 2018-03-08 LAB — CBC
HCT: 40.7 % (ref 40.0–52.0)
Hemoglobin: 13.3 g/dL (ref 13.0–18.0)
MCH: 26.1 pg (ref 26.0–34.0)
MCHC: 32.6 g/dL (ref 32.0–36.0)
MCV: 80.2 fL (ref 80.0–100.0)
PLATELETS: 303 10*3/uL (ref 150–440)
RBC: 5.08 MIL/uL (ref 4.40–5.90)
RDW: 20.8 % — AB (ref 11.5–14.5)
WBC: 14.6 10*3/uL — ABNORMAL HIGH (ref 3.8–10.6)

## 2018-03-08 MED ORDER — OMEPRAZOLE 20 MG PO CPDR: 20.0000 mg | DELAYED_RELEASE_CAPSULE | Freq: Every day | ORAL | 3 refills | Status: DC

## 2018-03-08 NOTE — ED Notes (Signed)

## 2018-03-08 NOTE — ED Provider Notes (Signed)
[facility] Emergency Department Provider Note  ____________________________________________  Time seen: Approximately 3:12 PM  I have reviewed the triage vital signs and the nursing notes.   HISTORY  Chief Complaint Loss of Consciousness    HPI Tony Calderon is a 57 y.o. male with a history of hypertension and GERD who reports a coughing fit yesterday that caused him to pass out in his bathroom.  When he woke up he had hit the side of the bathtub with his right chest wall and had pain in that area.  Denies shortness of breath.  No head injury or neck pain.  These coughing symptoms are very frequent for him, worse last few days because he ran out of his omeprazole 4 days ago.  No aggravating or alleviating factors.      Past Medical History:  Diagnosis Date  . Allergy   . Asthma   . Hypertension      Patient Active Problem List   Diagnosis Date Noted  . COPD exacerbation (HCC) 01/03/2017     Past Surgical History:  Procedure Laterality Date  . ANKLE ARTHROCENTESIS Right      Prior to Admission medications   Medication Sig Start Date End Date Taking? Authorizing Provider  albuterol (PROVENTIL HFA;VENTOLIN HFA) 108 (90 Base) MCG/ACT inhaler Inhale 2 puffs into the lungs every 6 (six) hours as needed for wheezing or shortness of breath. 12/23/16   Malva Limes, MD  albuterol (PROVENTIL) (2.5 MG/3ML) 0.083% nebulizer solution Take 3 mLs (2.5 mg total) by nebulization every 6 (six) hours as needed for wheezing or shortness of breath. 01/04/17   Milagros Loll, MD  beclomethasone (QVAR) 40 MCG/ACT inhaler Inhale 1 puff into the lungs 2 (two) times daily. 01/04/17   Milagros Loll, MD  budesonide-formoterol (SYMBICORT) 160-4.5 MCG/ACT inhaler Inhale 2 puffs into the lungs 2 (two) times daily. Patient not taking: Reported on 01/03/2017 12/23/16   Malva Limes, MD  diphenhydrAMINE (BENADRYL) 25 mg capsule Take 25 mg by mouth every 6 (six) hours as  needed.     [provider]  hydrochlorothiazide (HYDRODIURIL) 25 MG tablet Take 1 tablet (25 mg total) by mouth daily. 12/23/16   Malva Limes, MD  lisinopril (PRINIVIL,ZESTRIL) 20 MG tablet Take 1 tablet (20 mg total) by mouth daily. 12/23/16   Malva Limes, MD  loratadine (CLARITIN) 10 MG tablet Take 10 mg by mouth daily.    [provider]  montelukast (SINGULAIR) 10 MG tablet Take 1 tablet (10 mg total) by mouth at bedtime. 12/23/16   Malva Limes, MD  Multiple Vitamin (MULTIVITAMIN) tablet Take 1 tablet by mouth daily.    [provider]     Allergies Patient has no known allergies.   Family History  Problem Relation Age of Onset  . Cancer Mother   . Hypertension Father   . Heart disease Father     Social History Social History   Tobacco Use  . Smoking status: Never Smoker  . Smokeless tobacco: Never Used  Substance Use Topics  . Alcohol use: Yes    Comment: occassionally  . Drug use: No    Review of Systems  Constitutional:   No fever or chills.  ENT:   No sore throat. No rhinorrhea. Cardiovascular:   Right chest wall pain.  Recent syncope.   Respiratory:   No dyspnea or cough. Gastrointestinal:   Negative for abdominal pain, vomiting and diarrhea.  Musculoskeletal:   Negative for focal pain  or swelling All other systems reviewed and are negative except as documented above in ROS and HPI.  ____________________________________________   PHYSICAL EXAM:  VITAL SIGNS: ED Triage Vitals  Enc Vitals Group     BP 03/08/18 1316 (!) 144/90     Pulse Rate 03/08/18 1316 88     Resp 03/08/18 1316 16     Temp 03/08/18 1316 98.2 F (36.8 C)     Temp Source 03/08/18 1316 Oral     SpO2 03/08/18 1316 95 %     Weight 03/08/18 1314 180 lb (81.6 kg)     Height 03/08/18 1314 5\' 9"  (1.753 m)     Head Circumference --      Peak Flow --      Pain Score 03/08/18 1314 8     Pain Loc --      Pain Edu? --      Excl. in GC? --     Vital  signs reviewed, nursing assessments reviewed.   Constitutional:   Alert and oriented. Non-toxic appearance. Eyes:   Conjunctivae are normal. EOMI. PERRL. ENT      Head:   Normocephalic and atraumatic.      Nose:   No congestion/rhinnorhea.       Mouth/Throat:   MMM, no pharyngeal erythema. No peritonsillar mass.       Neck:   No meningismus. Full ROM. Hematological/Lymphatic/Immunilogical:   No cervical lymphadenopathy. Cardiovascular:   RRR. Symmetric bilateral radial and DP pulses.  No murmurs. Cap refill less than 2 seconds. Respiratory:   Normal respiratory effort without tachypnea/retractions. Breath sounds are clear and equal bilaterally. No wheezes/rales/rhonchi. Gastrointestinal:   Soft and nontender. Non distended. There is no CVA tenderness.  No rebound, rigidity, or guarding. Musculoskeletal:   Normal range of motion in all extremities. No joint effusions.  No lower extremity tenderness.  No edema.  No midline spinal tenderness.  Chest wall is stable.  The right inferolateral ribs are tender to the touch without focal tenderness or crepitus. Neurologic:   Normal speech and language.  Motor grossly intact. No acute focal neurologic deficits are appreciated.  Skin:    Skin is warm, dry and intact. No rash noted.  No petechiae, purpura, or bullae.  ____________________________________________    LABS (pertinent positives/negatives) (all labs ordered are listed, but only abnormal results are displayed) Labs Reviewed  BASIC METABOLIC PANEL - Abnormal; Notable for the following components:      Result Value   Glucose, Bld 115 (*)    All other components within normal limits  CBC - Abnormal; Notable for the following components:   WBC 14.6 (*)    RDW 20.8 (*)    All other components within normal limits  URINALYSIS, COMPLETE (UACMP) WITH MICROSCOPIC   ____________________________________________   EKG  Interpreted by me Normal sinus rhythm rate of 80, left axis, left  bundle branch block.  No acute ischemic changes.  Unchanged compared to January 03, 2017.  ____________________________________________    RADIOLOGY  No results found.  ____________________________________________   PROCEDURES Procedures  ____________________________________________    CLINICAL IMPRESSION / ASSESSMENT AND PLAN / ED COURSE  Pertinent labs & imaging results that were available during my care of the patient were reviewed by me and considered in my medical decision making (see chart for details).    Patient presents with syncope due to coughing fit, likely vagal.  From the fall he had blunt trauma to the right chest wall.  No pneumothorax or hemothorax.  Otherwise no significant traumatic findings.  X-ray is unremarkable does not show any effusion or pneumothorax or grossly broken ribs.  Suitable for outpatient follow-up, NSAIDs.      ____________________________________________   FINAL CLINICAL IMPRESSION(S) / ED DIAGNOSES    Final diagnoses:  Syncope, unspecified syncope type  Contusion of right chest wall, initial encounter     ED Discharge Orders    None      Portions of this note were generated with dragon dictation software. Dictation errors may occur despite best attempts at proofreading.    [name], MD 03/08/18 [phone]

## 2018-03-08 NOTE — ED Triage Notes (Signed)
Patient states "I started having a coughing attack last night and went into bathroom and then blacked out and woke up on the bathroom floor. My right side hurts and I think I hit my side on the tub" Denies pain anywhere besides Right ribs. Denies double or blurry vision. Ambulatory in triage with no problems.

## 2018-03-08 NOTE — Discharge Instructions (Addendum)
Your chest xray shows a slight rib fracture in your lower right chest.

## 2018-10-20 ENCOUNTER — Ambulatory Visit (HOSPITAL_COMMUNITY)
Admission: EM | Admit: 2018-10-20 | Discharge: 2018-10-20 | Disposition: A | Discharge status: Home / Self Care | Payer: Self-pay | Attending: Family Medicine | Admitting: Family Medicine

## 2018-10-20 ENCOUNTER — Encounter (HOSPITAL_COMMUNITY): Payer: Self-pay | Admitting: Emergency Medicine

## 2018-10-20 ENCOUNTER — Other Ambulatory Visit: Payer: Self-pay

## 2018-10-20 DIAGNOSIS — B9789 Other viral agents as the cause of diseases classified elsewhere: Secondary | ICD-10-CM

## 2018-10-20 DIAGNOSIS — J069 Acute upper respiratory infection, unspecified: Secondary | ICD-10-CM

## 2018-10-20 MED ORDER — HYDROCODONE-HOMATROPINE 5-1.5 MG/5ML PO SYRP: 5.0000 mL | ORAL_SOLUTION | Freq: Four times a day (QID) | ORAL | 0 refills | Status: DC | PRN

## 2018-10-20 NOTE — ED Provider Notes (Signed)
[facility]    CSN: [id number] Arrival date & time: 10/20/18  1348     History   Chief Complaint Chief Complaint  Patient presents with  . Appointment    2pm  . Cough    HPI Tony Calderon is a 58 y.o. male.   No recent traveling. Has been using albuterol inhaler at night along with daily steroid inhaler.    Cough  Cough characteristics:  Non-productive Severity:  Moderate Duration:  5 days Timing:  Constant Progression:  Waxing and waning Chronicity:  New Smoker: no   Context: not animal exposure, not exposure to allergens, not fumes, not occupational exposure, not sick contacts, not smoke exposure, not upper respiratory infection, not weather changes and not with activity   Relieved by:  Cough suppressants (using day quil and nyquil. the cough is worse at night with a lot of phlegm  in the throat. he has been taking his allergy meds daily. ) Worsened by:  Nothing Associated symptoms: no chest pain, no chills, no ear fullness, no ear pain, no eye discharge, no fever, no headaches, no myalgias, no rash, no rhinorrhea, no shortness of breath, no sinus congestion, no sore throat, no weight loss and no wheezing     Past Medical History:  Diagnosis Date  . Allergy   . Asthma   . Hypertension     Patient Active Problem List   Diagnosis Date Noted  . COPD exacerbation (HCC) 01/03/2017    Past Surgical History:  Procedure Laterality Date  . ANKLE ARTHROCENTESIS Right        Home Medications    Prior to Admission medications   Medication Sig Start Date End Date Taking? Authorizing Provider  albuterol (PROVENTIL HFA;VENTOLIN HFA) 108 (90 Base) MCG/ACT inhaler Inhale 2 puffs into the lungs every 6 (six) hours as needed for wheezing or shortness of breath. 12/23/16   Malva Limes, MD  albuterol (PROVENTIL) (2.5 MG/3ML) 0.083% nebulizer solution Take 3 mLs (2.5 mg total) by nebulization every 6 (six) hours as needed for wheezing or shortness of  breath. Patient not taking: Reported on 10/20/2018 01/04/17   Milagros Loll, MD  beclomethasone (QVAR) 40 MCG/ACT inhaler Inhale 1 puff into the lungs 2 (two) times daily. 01/04/17   Milagros Loll, MD  budesonide-formoterol (SYMBICORT) 160-4.5 MCG/ACT inhaler Inhale 2 puffs into the lungs 2 (two) times daily. Patient not taking: Reported on 01/03/2017 12/23/16   Malva Limes, MD  diphenhydrAMINE (BENADRYL) 25 mg capsule Take 25 mg by mouth every 6 (six) hours as needed.     [provider]  hydrochlorothiazide (HYDRODIURIL) 25 MG tablet Take 1 tablet (25 mg total) by mouth daily. Patient not taking: Reported on 10/20/2018 12/23/16   Malva Limes, MD  HYDROcodone-homatropine [facility]) 5-1.5 MG/5ML syrup Take 5 mLs by mouth every 6 (six) hours as needed for cough. 10/20/18   Dahlia Byes A, NP  lisinopril (PRINIVIL,ZESTRIL) 20 MG tablet Take 1 tablet (20 mg total) by mouth daily. Patient not taking: Reported on 10/20/2018 12/23/16   Malva Limes, MD  loratadine (CLARITIN) 10 MG tablet Take 10 mg by mouth daily.    [provider]  montelukast (SINGULAIR) 10 MG tablet Take 1 tablet (10 mg total) by mouth at bedtime. Patient not taking: Reported on 10/20/2018 12/23/16   Malva Limes, MD  Multiple Vitamin (MULTIVITAMIN) tablet Take 1 tablet by mouth daily.    [provider]  omeprazole (PRILOSEC) 20 MG capsule Take 1  capsule (20 mg total) by mouth daily. 03/08/18 04/07/18  [name], MD    Family History Family History  Problem Relation Age of Onset  . Cancer Mother   . Hypertension Father   . Heart disease Father     Social History Social History   Tobacco Use  . Smoking status: Never Smoker  . Smokeless tobacco: Never Used  Substance Use Topics  . Alcohol use: Yes    Comment: occassionally  . Drug use: No     Allergies   Patient has no known allergies.   Review of Systems Review of Systems  Constitutional: Negative for chills, fever and  weight loss.  HENT: Negative for ear pain, rhinorrhea and sore throat.   Eyes: Negative for discharge.  Respiratory: Positive for cough. Negative for shortness of breath and wheezing.   Cardiovascular: Negative for chest pain.  Musculoskeletal: Negative for myalgias.  Skin: Negative for rash.  Neurological: Negative for headaches.     Physical Exam Triage Vital Signs ED Triage Vitals  Enc Vitals Group     BP 10/20/18 1410 (!) 141/86     Pulse Rate 10/20/18 1410 77     Resp 10/20/18 1410 16     Temp 10/20/18 1410 97.9 F (36.6 C)     Temp Source 10/20/18 1410 Oral     SpO2 10/20/18 1410 100 %     Weight --      Height --      Head Circumference --      Peak Flow --      Pain Score 10/20/18 1422 0     Pain Loc --      Pain Edu? --      Excl. in GC? --    No data found.  Updated Vital Signs BP (!) 141/86 (BP Location: Left Arm)   Pulse 77   Temp 97.9 F (36.6 C) (Oral)   Resp 16   SpO2 100%   Visual Acuity Right Eye Distance:   Left Eye Distance:   Bilateral Distance:    Right Eye Near:   Left Eye Near:    Bilateral Near:     Physical Exam Vitals signs and nursing note reviewed.  Constitutional:      General: He is not in acute distress.    Appearance: Normal appearance. He is well-developed. He is not ill-appearing, toxic-appearing or diaphoretic.  HENT:     Head: Normocephalic and atraumatic.     Right Ear: Tympanic membrane and ear canal normal.     Left Ear: Tympanic membrane normal.     Nose: Nose normal.     Mouth/Throat:     Pharynx: Oropharynx is clear.  Eyes:     Conjunctiva/sclera: Conjunctivae normal.  Neck:     Musculoskeletal: Neck supple.  Cardiovascular:     Rate and Rhythm: Normal rate and regular rhythm.     Heart sounds: No murmur.  Pulmonary:     Effort: Pulmonary effort is normal. No respiratory distress.     Breath sounds: Normal breath sounds.  Musculoskeletal: Normal range of motion.  Skin:    General: Skin is warm and dry.   Neurological:     Mental Status: He is alert.  Psychiatric:        Mood and Affect: Mood normal.      UC Treatments / Results  Labs (all labs ordered are listed, but only abnormal results are displayed) Labs Reviewed - No data to display  EKG None  Radiology  No results found.  Procedures Procedures (including critical care time)  Medications Ordered in UC Medications - No data to display  Initial Impression / Assessment and Plan / UC Course  I have reviewed the triage vital signs and the nursing notes.  Pertinent labs & imaging results that were available during my care of the patient were reviewed by me and considered in my medical decision making (see chart for details).     Symptoms consistent with viral URI Symptomatic treatment Hycodan for worsening cough Told to continue medication as prescribed Work note given as requested Follow up as needed for continued or worsening symptoms  Final Clinical Impressions(s) / UC Diagnoses   Final diagnoses:  Viral URI with cough     Discharge Instructions     I believe  this is a viral upper respiratory infection Keep taking your  allergy medication.  I will give you something stronger for the cough to take at bedtime.  You can take the day quil as needed.  Work note given Rest and stay hydrated.  Follow up as needed for continued or worsening symptoms     ED Prescriptions    Medication Sig Dispense Auth. Provider   HYDROcodone-homatropine (HYCODAN) 5-1.5 MG/5ML syrup Take 5 mLs by mouth every 6 (six) hours as needed for cough. 120 mL Dahlia Byes A, NP     Controlled Substance Prescriptions [place] Controlled Substance Registry consulted? Not Applicable   Janace Aris, NP 10/20/18 1507

## 2018-10-20 NOTE — ED Triage Notes (Signed)
Pt states hes been working 7 days a week for months and has hx of asthma, states he uses advair every day and albuterol. C/o cough x5 days.

## 2018-10-20 NOTE — Discharge Instructions (Addendum)
I believe  this is a viral upper respiratory infection Keep taking your  allergy medication.  I will give you something stronger for the cough to take at bedtime.  You can take the day quil as needed.  Work note given Rest and stay hydrated.  Follow up as needed for continued or worsening symptoms

## 2018-12-07 ENCOUNTER — Other Ambulatory Visit: Payer: Self-pay

## 2018-12-07 ENCOUNTER — Encounter (HOSPITAL_COMMUNITY): Payer: Self-pay

## 2018-12-07 ENCOUNTER — Ambulatory Visit (HOSPITAL_COMMUNITY)
Admission: EM | Admit: 2018-12-07 | Discharge: 2018-12-07 | Disposition: A | Discharge status: Home / Self Care | Payer: Self-pay | Attending: Family Medicine | Admitting: Family Medicine

## 2018-12-07 DIAGNOSIS — H1031 Unspecified acute conjunctivitis, right eye: Secondary | ICD-10-CM

## 2018-12-07 MED ORDER — POLYMYXIN B-TRIMETHOPRIM 10000-0.1 UNIT/ML-% OP SOLN: 1.0000 [drp] | OPHTHALMIC | 0 refills | Status: AC

## 2018-12-07 NOTE — Discharge Instructions (Addendum)
We are treating you for bacterial conjunctivitis or pinkeye. Use the eyedrops every 4 hours while awake  Make sure you wash her pillowcases Monitor your symptoms closely and if it significantly gets worse or does not improve within the next couple days you will want to follow-up.

## 2018-12-07 NOTE — ED Triage Notes (Signed)
Pt cc pink eye on the right side. Pt states its been weeping and drainage and pink. Pt states it's very sore.

## 2018-12-07 NOTE — ED Provider Notes (Signed)
[facility]    CSN: [id number] Arrival date & time: 12/07/18  1226     History   Chief Complaint Chief Complaint  Patient presents with  . Conjunctivitis    HPI Tony Calderon is a 58 y.o. male.   Pt is a 58 year old male that presents with right eye pain, itching and swelling. This has been constant and worse since about 1 week. He was treating this as allergies and taking antihistamines. Reports this didn't help. He is having upper and lower lid swelling with drainage from the eye. Hazy vision. Denies any foreign body in the eye or eye injuries.  No fevers, chills, headaches or dizziness.  ROS per HPI      Past Medical History:  Diagnosis Date  . Allergy   . Asthma   . Hypertension     Patient Active Problem List   Diagnosis Date Noted  . COPD exacerbation (HCC) 01/03/2017    Past Surgical History:  Procedure Laterality Date  . ANKLE ARTHROCENTESIS Right        Home Medications    Prior to Admission medications   Medication Sig Start Date End Date Taking? Authorizing Provider  albuterol (PROVENTIL HFA;VENTOLIN HFA) 108 (90 Base) MCG/ACT inhaler Inhale 2 puffs into the lungs every 6 (six) hours as needed for wheezing or shortness of breath. 12/23/16   Malva Limes, MD  albuterol (PROVENTIL) (2.5 MG/3ML) 0.083% nebulizer solution Take 3 mLs (2.5 mg total) by nebulization every 6 (six) hours as needed for wheezing or shortness of breath. Patient not taking: Reported on 10/20/2018 01/04/17   Milagros Loll, MD  beclomethasone (QVAR) 40 MCG/ACT inhaler Inhale 1 puff into the lungs 2 (two) times daily. 01/04/17   Milagros Loll, MD  budesonide-formoterol (SYMBICORT) 160-4.5 MCG/ACT inhaler Inhale 2 puffs into the lungs 2 (two) times daily. Patient not taking: Reported on 01/03/2017 12/23/16   Malva Limes, MD  diphenhydrAMINE (BENADRYL) 25 mg capsule Take 25 mg by mouth every 6 (six) hours as needed.     [provider]  hydrochlorothiazide  (HYDRODIURIL) 25 MG tablet Take 1 tablet (25 mg total) by mouth daily. Patient not taking: Reported on 10/20/2018 12/23/16   Malva Limes, MD  HYDROcodone-homatropine [facility]) 5-1.5 MG/5ML syrup Take 5 mLs by mouth every 6 (six) hours as needed for cough. 10/20/18   Dahlia Byes A, NP  lisinopril (PRINIVIL,ZESTRIL) 20 MG tablet Take 1 tablet (20 mg total) by mouth daily. Patient not taking: Reported on 10/20/2018 12/23/16   Malva Limes, MD  loratadine (CLARITIN) 10 MG tablet Take 10 mg by mouth daily.    [provider]  montelukast (SINGULAIR) 10 MG tablet Take 1 tablet (10 mg total) by mouth at bedtime. Patient not taking: Reported on 10/20/2018 12/23/16   Malva Limes, MD  Multiple Vitamin (MULTIVITAMIN) tablet Take 1 tablet by mouth daily.    [provider]  omeprazole (PRILOSEC) 20 MG capsule Take 1 capsule (20 mg total) by mouth daily. 03/08/18 04/07/18  [name], MD  trimethoprim-polymyxin b (POLYTRIM) ophthalmic solution Place 1 drop into the right eye every 4 (four) hours for 7 days. 12/07/18 12/14/18  Janace Aris, NP    Family History Family History  Problem Relation Age of Onset  . Cancer Mother   . Hypertension Father   . Heart disease Father     Social History Social History   Tobacco Use  . Smoking status: Never Smoker  . Smokeless  tobacco: Never Used  Substance Use Topics  . Alcohol use: Yes    Comment: occassionally  . Drug use: No     Allergies   Patient has no known allergies.   Review of Systems Review of Systems   Physical Exam Triage Vital Signs ED Triage Vitals  Enc Vitals Group     BP --      Pulse --      Resp --      Temp --      Temp src --      SpO2 --      Weight 12/07/18 1254 175 lb (79.4 kg)     Height --      Head Circumference --      Peak Flow --      Pain Score 12/07/18 1253 8     Pain Loc --      Pain Edu? --      Excl. in GC? --    No data found.  Updated Vital Signs Wt 175 lb (79.4 kg)    BMI 25.84 kg/m   Visual Acuity Right Eye Distance:   Left Eye Distance:   Bilateral Distance:    Right Eye Near: R Near: 20/30 Left Eye Near:  L Near: 20/15 Bilateral Near:  20/15  Physical Exam Vitals signs and nursing note reviewed.  Constitutional:      General: He is not in acute distress.    Appearance: Normal appearance. He is not ill-appearing, toxic-appearing or diaphoretic.  HENT:     Head: Normocephalic and atraumatic.     Nose: Nose normal.  Eyes:     Extraocular Movements: Extraocular movements intact.     Pupils: Pupils are equal, round, and reactive to light.     Comments: Right upper and lower lid swelling with erythema. Scleral injection and drainage from eye. Tender to touch.   Neck:     Musculoskeletal: Normal range of motion.  Pulmonary:     Effort: Pulmonary effort is normal.  Musculoskeletal: Normal range of motion.  Skin:    General: Skin is warm and dry.  Neurological:     Mental Status: He is alert.  Psychiatric:        Mood and Affect: Mood normal.      UC Treatments / Results  Labs (all labs ordered are listed, but only abnormal results are displayed) Labs Reviewed - No data to display  EKG None  Radiology No results found.  Procedures Procedures (including critical care time)  Medications Ordered in UC Medications - No data to display  Initial Impression / Assessment and Plan / UC Course  I have reviewed the triage vital signs and the nursing notes.  Pertinent labs & imaging results that were available during my care of the patient were reviewed by me and considered in my medical decision making (see chart for details).    Symptoms consistent with bacterial conjunctivitis.  Treating with Polytrim.  Instructed to keep a close watch and if symptoms worsen or not improve the next couple of days he will need to be re-seen Pt understanding and agreed.  Tylenol or ibuprofen for the pain.   Final Clinical Impressions(s) / UC  Diagnoses   Final diagnoses:  Acute bacterial conjunctivitis of right eye     Discharge Instructions     We are treating you for bacterial conjunctivitis or pinkeye. Use the eyedrops every 4 hours while awake  Make sure you wash her pillowcases Monitor your symptoms closely  and if it significantly gets worse or does not improve within the next couple days you will want to follow-up.    ED Prescriptions    Medication Sig Dispense Auth. Provider   trimethoprim-polymyxin b (POLYTRIM) ophthalmic solution Place 1 drop into the right eye every 4 (four) hours for 7 days. 10 mL Dahlia Byes A, NP     Controlled Substance Prescriptions [place] Controlled Substance Registry consulted? Not Applicable   Janace Aris, NP 12/07/18 1456

## 2018-12-13 ENCOUNTER — Other Ambulatory Visit: Payer: Self-pay

## 2018-12-13 ENCOUNTER — Ambulatory Visit (HOSPITAL_COMMUNITY)
Admission: EM | Admit: 2018-12-13 | Discharge: 2018-12-13 | Disposition: A | Discharge status: Home / Self Care | Payer: Self-pay | Attending: Family Medicine | Admitting: Family Medicine

## 2018-12-13 ENCOUNTER — Encounter (HOSPITAL_COMMUNITY): Payer: Self-pay

## 2018-12-13 DIAGNOSIS — H5789 Other specified disorders of eye and adnexa: Secondary | ICD-10-CM

## 2018-12-13 MED ORDER — PREDNISOLONE ACETATE 1 % OP SUSP: 1.0000 [drp] | Freq: Four times a day (QID) | OPHTHALMIC | 0 refills | Status: DC

## 2018-12-13 MED ORDER — TETRACAINE HCL 0.5 % OP SOLN
OPHTHALMIC | Status: AC
  Filled 2018-12-13: qty 4

## 2018-12-13 NOTE — ED Provider Notes (Signed)
[facility]    CSN: [id number] Arrival date & time: 12/13/18  1547     History   Chief Complaint Chief Complaint  Patient presents with  . Eye Problem    HPI Tony Calderon is a 58 y.o. male.   HPI  See note of 12/07/2018.  Patient was seen here with a conjunctival injection, drainage, and diagnosed with pinkeye.  He was aided with Polytrim.  He has been using this consistently.  He has not had symptoms that been going on for almost 2 weeks.  His eye is very red.  Very irritated.  Feels sandy and grainy.  Very watery.  Drips constantly.  Some yellow crusting in the morning.  No vision impairment.  No photophobia.  No trauma.  The symptoms started at work so he thought it might be a chemical reaction, he did rinse his eyes. Patient has not under physicians care at this time.  Previously diagnosed with hypertension.  Stopped taking his lisinopril.  He is advised, strongly, that he needs to get back in with a primary care doctor to get his blood pressure under control.  He states that he has lisinopril at home that he could take for his blood pressure.  Past Medical History:  Diagnosis Date  . Allergy   . Asthma   . Hypertension     Patient Active Problem List   Diagnosis Date Noted  . COPD exacerbation (HCC) 01/03/2017    Past Surgical History:  Procedure Laterality Date  . ANKLE ARTHROCENTESIS Right        Home Medications    Prior to Admission medications   Medication Sig Start Date End Date Taking? Authorizing Provider  trimethoprim-polymyxin b (POLYTRIM) ophthalmic solution Place 1 drop into the right eye every 4 (four) hours for 7 days. 12/07/18 12/14/18 Yes Bast, Traci A, NP  beclomethasone (QVAR) 40 MCG/ACT inhaler Inhale 1 puff into the lungs 2 (two) times daily. 01/04/17   Milagros LollSudini, Srikar, MD  diphenhydrAMINE (BENADRYL) 25 mg capsule Take 25 mg by mouth every 6 (six) hours as needed.     [provider]  loratadine (CLARITIN) 10 MG tablet Take  10 mg by mouth daily.    [provider]  Multiple Vitamin (MULTIVITAMIN) tablet Take 1 tablet by mouth daily.    [provider]  omeprazole (PRILOSEC) 20 MG capsule Take 1 capsule (20 mg total) by mouth daily. 03/08/18 04/07/18  Sharman CheekStafford, Phillip, MD  prednisoLONE acetate (PRED FORTE) 1 % ophthalmic suspension Place 1 drop into the right eye 4 (four) times daily. 12/13/18   Eustace MooreNelson, Costa Jha Sue, MD    Family History Family History  Problem Relation Age of Onset  . Cancer Mother   . Hypertension Father   . Heart disease Father     Social History Social History   Tobacco Use  . Smoking status: Never Smoker  . Smokeless tobacco: Never Used  Substance Use Topics  . Alcohol use: Yes    Comment: occassionally  . Drug use: No     Allergies   Patient has no known allergies.   Review of Systems Review of Systems  Constitutional: Negative for chills and fever.  HENT: Negative for ear pain and sore throat.   Eyes: Positive for discharge, redness and itching. Negative for pain and visual disturbance.  Respiratory: Negative for cough and shortness of breath.   Cardiovascular: Negative for chest pain and palpitations.  Gastrointestinal: Negative for abdominal pain and vomiting.  Genitourinary: Negative  for dysuria and hematuria.  Musculoskeletal: Negative for arthralgias and back pain.  Skin: Negative for color change and rash.  Neurological: Negative for seizures and syncope.  All other systems reviewed and are negative.    Physical Exam Triage Vital Signs ED Triage Vitals  Enc Vitals Group     BP 12/13/18 1608 (!) 166/114     Pulse Rate 12/13/18 1608 99     Resp 12/13/18 1608 17     Temp 12/13/18 1608 98 F (36.7 C)     Temp Source 12/13/18 1608 Oral     SpO2 --    No data found.  Updated Vital Signs BP (!) 166/114 (BP Location: Right Arm)   Pulse 99   Temp 98 F (36.7 C) (Oral)   Resp 17      Physical Exam Constitutional:      General: He is  not in acute distress.    Appearance: He is well-developed.  HENT:     Head: Normocephalic and atraumatic.  Eyes:     General: Lids are normal. Lids are everted, no foreign bodies appreciated.        Right eye: Discharge present. No foreign body or hordeolum.     Intraocular pressure: Right eye pressure is 18 mmHg. Measurements were taken using a handheld tonometer.    Extraocular Movements: Extraocular movements intact.     Conjunctiva/sclera:     Right eye: Right conjunctiva is injected.     Pupils: Pupils are equal, round, and reactive to light. Pupils are equal.     Slit lamp exam:    Right eye: Anterior chamber quiet.     Comments: Right eye with conjunctival injection that is moderate to severe.  Constant tearing that drips down the face.  Visual acuity is normal.  Fluorescein exam is negative.  Anterior chamber is quiet.  Tonometry normal.  Neck:     Musculoskeletal: Normal range of motion.  Cardiovascular:     Rate and Rhythm: Normal rate.  Pulmonary:     Effort: Pulmonary effort is normal. No respiratory distress.  Abdominal:     General: There is no distension.     Palpations: Abdomen is soft.  Musculoskeletal: Normal range of motion.  Skin:    General: Skin is warm and dry.  Neurological:     Mental Status: He is alert.      UC Treatments / Results  Labs (all labs ordered are listed, but only abnormal results are displayed) Labs Reviewed - No data to display  EKG None  Radiology No results found.  Procedures Procedures (including critical care time)  Medications Ordered in UC Medications - No data to display  Initial Impression / Assessment and Plan / UC Course  I have reviewed the triage vital signs and the nursing notes.  Pertinent labs & imaging results that were available during my care of the patient were reviewed by me and considered in my medical decision making (see chart for details).     I called Dr. Baker Pierini who is on-call for  ophthalmology.  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  This would explain the patient's nonresponse to tetracaine.  He will see the patient in the morning.  Recommends I start steroid drops.  See plan Final Clinical Impressions(s) / UC Diagnoses   Final diagnoses:  Eye irritation     Discharge Instructions     I have spoken to Dr. Alben Spittle in ophthalmology.  His phone number is [phone].  Call at 730 tomorrow to set up an appointment for tomorrow Continue the Polytrim antibiotic eye drop, use 2 times today and once in the morning Get the steroid eyedrop and use 3 times tonight, spaced, then 1 drop in the morning before you see him Take Naprosyn for pain    ED Prescriptions    Medication Sig Dispense Auth. Provider   prednisoLONE acetate (PRED FORTE) 1 % ophthalmic suspension Place 1 drop into the right eye 4 (four) times daily. 5 mL [name], MD     Controlled Substance Prescriptions [place] Controlled Substance Registry consulted? Not Applicable   [name], MD 12/13/18 [phone]

## 2018-12-13 NOTE — Discharge Instructions (Addendum)
I have spoken to Dr. Alben Spittle in ophthalmology.  His phone number is [phone].  Call at 730 tomorrow to set up an appointment for tomorrow Continue the Polytrim antibiotic eye drop, use 2 times today and once in the morning Get the steroid eyedrop and use 3 times tonight, spaced, then 1 drop in the morning before you see him Take Naprosyn for pain

## 2018-12-13 NOTE — ED Notes (Signed)
Pt states he was taken off his bp meds 1-2 years ago but thinks his bp may be creeping back up and wonders if he should be restarted on his old meds.

## 2018-12-13 NOTE — ED Triage Notes (Signed)
Patient presents to Urgent Care with complaints of right eye pain and redness since 10 days ago. Patient reports he was seen last week for same and has been using the eyedrops he was prescribed but they seem to only work for 30 mins before his vision gets cloudy again. Redness noted to right eye, no symptoms on left.

## 2018-12-28 ENCOUNTER — Ambulatory Visit (HOSPITAL_COMMUNITY)
Admission: EM | Admit: 2018-12-28 | Discharge: 2018-12-28 | Disposition: A | Discharge status: Home / Self Care | Payer: Self-pay | Attending: Family Medicine | Admitting: Family Medicine

## 2018-12-28 ENCOUNTER — Encounter (HOSPITAL_COMMUNITY): Payer: Self-pay | Admitting: Emergency Medicine

## 2018-12-28 ENCOUNTER — Other Ambulatory Visit: Payer: Self-pay

## 2018-12-28 DIAGNOSIS — M546 Pain in thoracic spine: Secondary | ICD-10-CM

## 2018-12-28 DIAGNOSIS — H5789 Other specified disorders of eye and adnexa: Secondary | ICD-10-CM

## 2018-12-28 DIAGNOSIS — I10 Essential (primary) hypertension: Secondary | ICD-10-CM

## 2018-12-28 MED ORDER — KETOROLAC TROMETHAMINE 30 MG/ML IJ SOLN
30.0000 mg | Freq: Once | INTRAMUSCULAR | Status: AC
  Administered 2018-12-28: 16:00:00 30 mg via INTRAMUSCULAR

## 2018-12-28 MED ORDER — CYCLOBENZAPRINE HCL 5 MG PO TABS: 5.0000 mg | ORAL_TABLET | Freq: Two times a day (BID) | ORAL | 0 refills | Status: DC | PRN

## 2018-12-28 MED ORDER — KETOROLAC TROMETHAMINE 30 MG/ML IJ SOLN
INTRAMUSCULAR | Status: AC
  Filled 2018-12-28: qty 1

## 2018-12-28 MED ORDER — TETRACAINE HCL 0.5 % OP SOLN
OPHTHALMIC | Status: AC
  Filled 2018-12-28: qty 4

## 2018-12-28 NOTE — ED Triage Notes (Signed)
Pt also states his R eye is red.

## 2018-12-28 NOTE — ED Triage Notes (Signed)
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

## 2018-12-28 NOTE — Discharge Instructions (Addendum)
Eye Irritation Please follow up with chris weaver as originally [phone]. If unable to contact please use contact for Dr. Allena Katz  Muscle Aches We gave you a shot of Toradol today Use anti-inflammatories for pain/swelling. You may take up to 800 mg Ibuprofen every 8 hours with food. You may supplement Ibuprofen with Tylenol [phone] every 8 hours.  You may use flexeril as needed to help with pain. This is a muscle relaxer and causes sedation- please use only at bedtime or when you will be home and not have to drive/work  Follow-up if developing fevers, respiratory symptoms, chills or worsening pain

## 2018-12-28 NOTE — ED Provider Notes (Signed)
[facility]    CSN: [id number] Arrival date & time: 12/28/18  1437     History   Chief Complaint Chief Complaint  Patient presents with  . Generalized Body Aches    HPI Tony Calderon is a 58 y.o. male history of hypertension, asthma, presenting today for evaluation of muscle aches as well as right eye irritation.  Patient notes that over the weekend he was clearing some land, on Monday he was slightly sore, today soreness has increased.  He he most notably feels it in his back and upper arms, but does have some discomfort in legs as well.  Denies any fevers or associated chills.  Denies associated URI symptoms including denying cough, congestion or sore throat.  Denies associated nausea vomiting or diarrhea.  Denies close contacts that are sick, denies known exposure to COVID.  Feels confident symptoms are related to overexertion.  He has taken some ibuprofen 800 as well as naproxen 500.  He is having discomfort with most movements and difficulty sleeping at night.  He also notes continued issues with his right eye.  He has been seen here twice in the past month for similar symptoms, initially treated for conjunctivitis with Polytrim, then seen and treated more for episcleritis with prednisone.  This had been discussed with ophthalmologist on call Tony Calderon and was recommended to follow-up in the morning.  He attempted to call, but never had direct contact with him and did not follow-up with ophthalmology, as his symptoms did fully resolve.  A few days ago his symptoms returned.  He started using the prednisone drops again with mild improvement.  Denies changes in vision.  Has had frequent watering.  Denies eye pain, but does endorse a sensation of foreign body.  HPI  Past Medical History:  Diagnosis Date  . Allergy   . Asthma   . Hypertension     Patient Active Problem List   Diagnosis Date Noted  . COPD exacerbation (HCC) 01/03/2017    Past Surgical History:   Procedure Laterality Date  . ANKLE ARTHROCENTESIS Right        Home Medications    Prior to Admission medications   Medication Sig Start Date End Date Taking? Authorizing Provider  beclomethasone (QVAR) 40 MCG/ACT inhaler Inhale 1 puff into the lungs 2 (two) times daily. 01/04/17   Milagros Loll, MD  cyclobenzaprine (FLEXERIL) 5 MG tablet Take 1-2 tablets (5-10 mg total) by mouth 2 (two) times daily as needed for muscle spasms. 12/28/18   Gaelle Adriance C, PA-C  diphenhydrAMINE (BENADRYL) 25 mg capsule Take 25 mg by mouth every 6 (six) hours as needed.     [provider]  loratadine (CLARITIN) 10 MG tablet Take 10 mg by mouth daily.    [provider]  Multiple Vitamin (MULTIVITAMIN) tablet Take 1 tablet by mouth daily.    [provider]  omeprazole (PRILOSEC) 20 MG capsule Take 1 capsule (20 mg total) by mouth daily. 03/08/18 04/07/18  [name], MD  prednisoLONE acetate (PRED FORTE) 1 % ophthalmic suspension Place 1 drop into the right eye 4 (four) times daily. 12/13/18   [name], MD    Family History Family History  Problem Relation Age of Onset  . Cancer Mother   . Hypertension Father   . Heart disease Father     Social History Social History   Tobacco Use  . Smoking status: Never Smoker  . Smokeless tobacco: Never Used  Substance Use Topics  .  Alcohol use: Yes    Comment: occassionally  . Drug use: No     Allergies   Patient has no known allergies.   Review of Systems Review of Systems  Constitutional: Negative for activity change, appetite change, chills, fatigue and fever.  HENT: Negative for congestion, ear pain, rhinorrhea, sinus pressure, sore throat and trouble swallowing.   Eyes: Positive for discharge, redness and itching. Negative for photophobia and visual disturbance.  Respiratory: Negative for cough, chest tightness and shortness of breath.   Cardiovascular: Negative for chest pain.  Gastrointestinal:  Negative for abdominal pain, diarrhea, nausea and vomiting.  Musculoskeletal: Positive for back pain, myalgias and neck pain. Negative for arthralgias and neck stiffness.  Skin: Negative for rash.  Neurological: Negative for dizziness, light-headedness and headaches.     Physical Exam Triage Vital Signs ED Triage Vitals [12/28/18 1459]  Enc Vitals Group     BP (!) 148/102     Pulse Rate 98     Resp 18     Temp 98 F (36.7 C)     Temp src      SpO2 97 %     Weight      Height      Head Circumference      Peak Flow      Pain Score 8     Pain Loc      Pain Edu?      Excl. in GC?    No data found.  Updated Vital Signs BP (!) 148/102   Pulse 98   Temp 98 F (36.7 C)   Resp 18   SpO2 97%    Physical Exam Vitals signs and nursing note reviewed.  Constitutional:      Appearance: He is well-developed.  HENT:     Head: Normocephalic and atraumatic.  Eyes:     Extraocular Movements: Extraocular movements intact.     Conjunctiva/sclera: Conjunctivae normal.     Pupils: Pupils are equal, round, and reactive to light.     Comments: Right eyes slightly more injected compared to left, increased erythema on medial aspect, slightly raised white area visualized on limbal border on medial aspect of eye, see picture below Anterior chamber clear No abrasion or ulceration noted on fluorescein staining Pressure obtained with Tono-Pen averaging 18  Neck:     Musculoskeletal: Neck supple.  Cardiovascular:     Rate and Rhythm: Normal rate and regular rhythm.     Heart sounds: No murmur.  Pulmonary:     Effort: Pulmonary effort is normal. No respiratory distress.     Breath sounds: Normal breath sounds.  Abdominal:     Palpations: Abdomen is soft.     Tenderness: There is no abdominal tenderness.  Musculoskeletal:     Comments: Nontender to palpation of cervical, thoracic and lumbar spine midline, diffuse tenderness throughout palpation of thoracic and lumbar musculature  bilaterally Full active range of motion of neck, upper extremities and lower extremities  Strength at shoulders and grip strength 5/5 and equal bilaterally Hip and knee strength 5/5 and equal bilaterally Patellar reflex 2+ bilaterally  Gait without abnormality Slow to change position between supine and sitting  Skin:    General: Skin is warm and dry.  Neurological:     Mental Status: He is alert.        UC Treatments / Results  Labs (all labs ordered are listed, but only abnormal results are displayed) Labs Reviewed - No data to display  EKG None  Radiology No results found.  Procedures Procedures (including critical care time)  Medications Ordered in UC Medications  ketorolac (TORADOL) 30 MG/ML injection 30 mg (30 mg Intramuscular Given 12/28/18 1548)    Initial Impression / Assessment and Plan / UC Course  I have reviewed the triage vital signs and the nursing notes.  Pertinent labs & imaging results that were available during my care of the patient were reviewed by me and considered in my medical decision making (see chart for details).     Body aches most likely musculoskeletal, no specific mechanism of injury, using all extremities appropriately.  Will provide Toradol prior to discharge, continue anti-inflammatories, supplement with Flexeril as needed.  Would expect gradual resolution.  We will have patient follow-up with ophthalmologist for further evaluation of symptoms.  Provided Tony Calderon's contact number as he was intended to follow-up with him previously.  Also provided Tony Calderon who is on-call today.  Did advise that he may continue to use the prednisone drops as prescribed before, but only for the 5-day period.  Continue to monitor.  Discussed strict return precautions. Patient verbalized understanding and is agreeable with plan.  Final Clinical Impressions(s) / UC Diagnoses   Final diagnoses:  Eye irritation  Acute bilateral thoracic back pain      Discharge Instructions     Eye Irritation Please follow up with Tony Calderon as originally [phone]. If unable to contact please use contact for Tony Calderon  Muscle Aches We gave you a shot of Toradol today Use anti-inflammatories for pain/swelling. You may take up to 800 mg Ibuprofen every 8 hours with food. You may supplement Ibuprofen with Tylenol [phone] mg every 8 hours.  You may use flexeril as needed to help with pain. This is a muscle relaxer and causes sedation- please use only at bedtime or when you will be home and not have to drive/work  Follow-up if developing fevers, respiratory symptoms, chills or worsening pain    ED Prescriptions    Medication Sig Dispense Auth. Provider   cyclobenzaprine (FLEXERIL) 5 MG tablet Take 1-2 tablets (5-10 mg total) by mouth 2 (two) times daily as needed for muscle spasms. 24 tablet Kincaid Tiger, PoynetteHallie C, PA-C     Controlled Substance Prescriptions  Controlled Substance Registry consulted? Not Applicable   Lew DawesWieters, Rosbel Buckner C, [place] JerseyPA-C 12/28/18 1918

## 2019-03-24 ENCOUNTER — Other Ambulatory Visit: Payer: Self-pay

## 2019-03-24 DIAGNOSIS — Z20822 Contact with and (suspected) exposure to covid-19: Secondary | ICD-10-CM

## 2019-03-25 LAB — NOVEL CORONAVIRUS, NAA: SARS-CoV-2, NAA: NOT DETECTED

## 2019-08-09 ENCOUNTER — Encounter (HOSPITAL_COMMUNITY): Payer: Self-pay

## 2019-08-09 ENCOUNTER — Ambulatory Visit (HOSPITAL_COMMUNITY)
Admission: EM | Admit: 2019-08-09 | Discharge: 2019-08-09 | Disposition: A | Discharge status: Home / Self Care | Payer: Self-pay | Attending: Emergency Medicine | Admitting: Emergency Medicine

## 2019-08-09 ENCOUNTER — Other Ambulatory Visit: Payer: Self-pay

## 2019-08-09 DIAGNOSIS — J4520 Mild intermittent asthma, uncomplicated: Secondary | ICD-10-CM

## 2019-08-09 DIAGNOSIS — F419 Anxiety disorder, unspecified: Secondary | ICD-10-CM

## 2019-08-09 DIAGNOSIS — Z760 Encounter for issue of repeat prescription: Secondary | ICD-10-CM

## 2019-08-09 DIAGNOSIS — I10 Essential (primary) hypertension: Secondary | ICD-10-CM

## 2019-08-09 MED ORDER — FLUTICASONE-SALMETEROL 500-50 MCG/DOSE IN AEPB: 1.0000 | INHALATION_SPRAY | Freq: Two times a day (BID) | RESPIRATORY_TRACT | 0 refills | Status: DC

## 2019-08-09 MED ORDER — HYDROXYZINE HCL 25 MG PO TABS: 25.0000 mg | ORAL_TABLET | Freq: Four times a day (QID) | ORAL | 0 refills | Status: DC | PRN

## 2019-08-09 MED ORDER — ALBUTEROL SULFATE HFA 108 (90 BASE) MCG/ACT IN AERS
2.0000 | INHALATION_SPRAY | Freq: Once | RESPIRATORY_TRACT | Status: AC
  Administered 2019-08-09: 15:00:00 2 via RESPIRATORY_TRACT

## 2019-08-09 MED ORDER — ALBUTEROL SULFATE HFA 108 (90 BASE) MCG/ACT IN AERS
INHALATION_SPRAY | RESPIRATORY_TRACT | Status: AC
  Filled 2019-08-09: qty 6.7

## 2019-08-09 MED ORDER — ALPRAZOLAM 0.25 MG PO TABS: 0.2500 mg | ORAL_TABLET | Freq: Every evening | ORAL | 0 refills | Status: DC | PRN

## 2019-08-09 NOTE — Discharge Instructions (Addendum)
Asthma We provided you with a refill of your albuterol inhaler, please take prescription needed for [facility] or other pharmacy for refill of this Blood pressure Your blood pressure was elevated today, continue to monitor this at home, if you continue to have elevated blood pressure when following up with primary care you likely need to restart blood pressure medicine  If you have any chest pain, shortness of breath, headache, vision changes, difficulty speaking, weakness please follow-up in the emergency room Anxiety Please use hydroxyzine as needed for anxiety every 4-6 hours, may cause drowsiness, do not drive or work after taking  I provided you with 8 tablets of Xanax to use only if hydroxyzine not helping with symptoms, use sparingly and only for severe anxiety, do not drive or work after taking  Please follow-up with primary care for further treatment and management of your anxiety

## 2019-08-09 NOTE — ED Triage Notes (Signed)
Pt. States he needs needs med. Refill of ALBUTEROL &  AVART. States he has an apt. An the end of January at [facility].

## 2019-08-10 NOTE — ED Provider Notes (Signed)
[facility]    CSN: [id number] Arrival date & time: 08/09/19  1239      History   Chief Complaint Chief Complaint  Patient presents with  . Medication Refill    HPI Tony Calderon is a 59 y.o. male history of asthma, hypertension and anxiety presenting today for medication refill.  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  Is not had a primary care because of this.  [facility] getting [facility] and has an appointment on 1/27.  He is going to run out of his asthma inhaler soon and is requesting refill for this.  He has had very few flares since he has been on Advair.  Uses albuterol as needed.  He needs refill of both of these.  He also notes that with all of his personal life stressors he has had increased anxiety.  He does not currently take any anxiety medicines but in the past he has been prescribed alprazolam that he used as needed.  States that this helped significantly.  Previously on lisinopril for hypertension, but was taken off of this as his blood pressure improved no longer needed medicine.  Blood pressure elevated today.  Denies any chest pain, shortness of breath, dizziness, lightheadedness, vision changes or headache.  HPI  Past Medical History:  Diagnosis Date  . Allergy   . Asthma   . Hypertension     Patient Active Problem List   Diagnosis Date Noted  . COPD exacerbation (HCC) 01/03/2017    Past Surgical History:  Procedure Laterality Date  . ANKLE ARTHROCENTESIS Right        Home Medications    Prior to Admission medications   Medication Sig Start Date End Date Taking? Authorizing Provider  ALPRAZolam (XANAX) 0.25 MG tablet Take 1 tablet (0.25 mg total) by mouth at bedtime as needed for anxiety. 08/09/19   Nichole Neyer C, PA-C  beclomethasone (QVAR) 40 MCG/ACT inhaler Inhale 1 puff into the lungs 2 (two) times daily. 01/04/17   Milagros Loll, MD  cyclobenzaprine  (FLEXERIL) 5 MG tablet Take 1-2 tablets (5-10 mg total) by mouth 2 (two) times daily as needed for muscle spasms. 12/28/18   Mesa Janus C, PA-C  diphenhydrAMINE (BENADRYL) 25 mg capsule Take 25 mg by mouth every 6 (six) hours as needed.     [provider]  Fluticasone-Salmeterol (ADVAIR DISKUS) 500-50 MCG/DOSE AEPB Inhale 1 puff into the lungs 2 (two) times daily. 08/09/19   Nainika Newlun C, PA-C  hydrOXYzine (ATARAX/VISTARIL) 25 MG tablet Take 1 tablet (25 mg total) by mouth every 6 (six) hours as needed for anxiety. 08/09/19   Nayleah Gamel C, PA-C  loratadine (CLARITIN) 10 MG tablet Take 10 mg by mouth daily.    [provider]  Multiple Vitamin (MULTIVITAMIN) tablet Take 1 tablet by mouth daily.    [provider]  omeprazole (PRILOSEC) 20 MG capsule Take 1 capsule (20 mg total) by mouth daily. 03/08/18 04/07/18  [name], MD  prednisoLONE acetate (PRED FORTE) 1 % ophthalmic suspension Place 1 drop into the right eye 4 (four) times daily. 12/13/18   [name], MD    Family History Family History  Problem Relation Age of Onset  . Cancer Mother   . Hypertension Father   . Heart disease Father     Social History Social History   Tobacco Use  . Smoking status: Never Smoker  .  Smokeless tobacco: Never Used  Substance Use Topics  . Alcohol use: Yes    Comment: occassionally  . Drug use: No     Allergies   Patient has no known allergies.   Review of Systems Review of Systems  Constitutional: Negative for activity change, appetite change, chills, fatigue and fever.  HENT: Negative for congestion, ear pain, rhinorrhea, sinus pressure, sore throat and trouble swallowing.   Eyes: Negative for discharge and redness.  Respiratory: Negative for cough, chest tightness and shortness of breath.   Cardiovascular: Negative for chest pain.  Gastrointestinal: Negative for abdominal pain, diarrhea, nausea and vomiting.  Musculoskeletal: Negative  for myalgias.  Skin: Negative for rash.  Neurological: Negative for dizziness, light-headedness and headaches.  Psychiatric/Behavioral: Positive for sleep disturbance. Negative for decreased concentration. The patient is nervous/anxious.      Physical Exam Triage Vital Signs ED Triage Vitals  Enc Vitals Group     BP 08/09/19 1400 (!) 159/83     Pulse Rate 08/09/19 1400 85     Resp 08/09/19 1400 18     Temp 08/09/19 1400 (!) 97.1 F (36.2 C)     Temp Source 08/09/19 1400 Oral     SpO2 08/09/19 1400 98 %     Weight 08/09/19 1357 178 lb (80.7 kg)     Height --      Head Circumference --      Peak Flow --      Pain Score 08/09/19 1357 0     Pain Loc --      Pain Edu? --      Excl. in GC? --    No data found.  Updated Vital Signs BP (!) 159/83 (BP Location: Right Arm)   Pulse 85   Temp (!) 97.1 F (36.2 C) (Oral)   Resp 18   Wt 178 lb (80.7 kg)   SpO2 98%   BMI 26.29 kg/m   Visual Acuity Right Eye Distance:   Left Eye Distance:   Bilateral Distance:    Right Eye Near:   Left Eye Near:    Bilateral Near:     Physical Exam Vitals and nursing note reviewed.  Constitutional:      Appearance: He is well-developed.     Comments: No acute distress  HENT:     Head: Normocephalic and atraumatic.     Nose: Nose normal.  Eyes:     Extraocular Movements: Extraocular movements intact.     Conjunctiva/sclera: Conjunctivae normal.     Pupils: Pupils are equal, round, and reactive to light.  Cardiovascular:     Rate and Rhythm: Normal rate.  Pulmonary:     Effort: Pulmonary effort is normal. No respiratory distress.     Comments: Breathing comfortably at rest, CTABL, no wheezing, rales or other adventitious sounds auscultated Abdominal:     General: There is no distension.  Musculoskeletal:        General: Normal range of motion.     Cervical back: Neck supple.  Skin:    General: Skin is warm and dry.  Neurological:     Mental Status: He is alert and oriented to  person, place, and time.      UC Treatments / Results  Labs (all labs ordered are listed, but only abnormal results are displayed) Labs Reviewed - No data to display  EKG   Radiology No results found.  Procedures Procedures (including critical care time)  Medications Ordered in UC Medications  albuterol (VENTOLIN HFA) 108 (90  Base) MCG/ACT inhaler 2 puff (2 puffs Inhalation Given 08/09/19 1438)    Initial Impression / Assessment and Plan / UC Course  I have reviewed the triage vital signs and the nursing notes.  Pertinent labs & imaging results that were available during my care of the patient were reviewed by me and considered in my medical decision making (see chart for details).     We will refill Advair, provided albuterol in clinic given financial concerns.  Advised to continue to monitor blood pressure, if continuing to be elevated at follow-up visit with primary care he likely will need to be restarted on blood pressure medicine.  For anxiety will provide hydroxyzine to use as needed, advised to try this first.  Discussed drowsiness associated with this.  Did provide a few tablets of Xanax as this has helped him in the past, but discussed that this is a controlled substance and to use sparingly.  Also discussed drowsiness with this and advised to not drive or work after taking and avoid mixing with hydroxyzine to avoid increased drowsiness/sedation.  For further refills he will need to be evaluated and seen by primary care for his anxiety.  Discussed strict return precautions. Patient verbalized understanding and is agreeable with plan.  Final Clinical Impressions(s) / UC Diagnoses   Final diagnoses:  Mild intermittent asthma without complication  Anxiety  Medication refill     Discharge Instructions     Asthma We provided you with a refill of your albuterol inhaler, please take prescription needed for Advair to [facility] or other pharmacy for  refill of this Blood pressure Your blood pressure was elevated today, continue to monitor this at home, if you continue to have elevated blood pressure when following up with primary care you likely need to restart blood pressure medicine  If you have any chest pain, shortness of breath, headache, vision changes, difficulty speaking, weakness please follow-up in the emergency room Anxiety Please use hydroxyzine as needed for anxiety every 4-6 hours, may cause drowsiness, do not drive or work after taking  I provided you with 8 tablets of Xanax to use only if hydroxyzine not helping with symptoms, use sparingly and only for severe anxiety, do not drive or work after taking  Please follow-up with primary care for further treatment and management of your anxiety   ED Prescriptions    Medication Sig Dispense Auth. Provider   Fluticasone-Salmeterol (ADVAIR DISKUS) 500-50 MCG/DOSE AEPB Inhale 1 puff into the lungs 2 (two) times daily. 60 each Sachit Gilman C, PA-C   hydrOXYzine (ATARAX/VISTARIL) 25 MG tablet Take 1 tablet (25 mg total) by mouth every 6 (six) hours as needed for anxiety. 24 tablet Robynn Marcel C, PA-C   ALPRAZolam (XANAX) 0.25 MG tablet Take 1 tablet (0.25 mg total) by mouth at bedtime as needed for anxiety. 8 tablet Khylan Sawyer, [place] C, PA-C     I have reviewed the PDMP during this encounter.   Janith Lima, PA-C 08/10/19 1303

## 2019-08-30 ENCOUNTER — Ambulatory Visit: Payer: Self-pay | Admitting: Family Medicine

## 2019-10-25 ENCOUNTER — Encounter: Payer: Self-pay | Admitting: Emergency Medicine

## 2019-10-25 ENCOUNTER — Other Ambulatory Visit: Payer: Self-pay

## 2019-10-25 ENCOUNTER — Emergency Department: Payer: Self-pay

## 2019-10-25 ENCOUNTER — Emergency Department
Admission: EM | Admit: 2019-10-25 | Discharge: 2019-10-25 | Disposition: A | Discharge status: Home / Self Care | Payer: Self-pay | Attending: Emergency Medicine | Admitting: Emergency Medicine

## 2019-10-25 DIAGNOSIS — Z79899 Other long term (current) drug therapy: Secondary | ICD-10-CM | POA: Insufficient documentation

## 2019-10-25 DIAGNOSIS — I10 Essential (primary) hypertension: Secondary | ICD-10-CM | POA: Insufficient documentation

## 2019-10-25 DIAGNOSIS — R0789 Other chest pain: Secondary | ICD-10-CM

## 2019-10-25 DIAGNOSIS — R1011 Right upper quadrant pain: Secondary | ICD-10-CM

## 2019-10-25 DIAGNOSIS — J449 Chronic obstructive pulmonary disease, unspecified: Secondary | ICD-10-CM | POA: Insufficient documentation

## 2019-10-25 DIAGNOSIS — K2920 Alcoholic gastritis without bleeding: Secondary | ICD-10-CM | POA: Insufficient documentation

## 2019-10-25 LAB — BASIC METABOLIC PANEL
Anion gap: 10 (ref 5–15)
BUN: 7 mg/dL (ref 6–20)
CO2: 24 mmol/L (ref 22–32)
Calcium: 8.6 mg/dL — ABNORMAL LOW (ref 8.9–10.3)
Chloride: 104 mmol/L (ref 98–111)
Creatinine, Ser: 0.72 mg/dL (ref 0.61–1.24)
GFR calc Af Amer: >60 mL/min (ref >60)
GFR calc non Af Amer: >60 mL/min (ref >60)
Glucose, Bld: 116 mg/dL — ABNORMAL HIGH (ref 70–99)
Potassium: 3.7 mmol/L (ref 3.5–5.1)
Sodium: 138 mmol/L (ref 135–145)

## 2019-10-25 LAB — HEPATIC FUNCTION PANEL
ALT: 55 U/L — ABNORMAL HIGH (ref 0–44)
AST: 95 U/L — ABNORMAL HIGH (ref 15–41)
Albumin: 3.9 g/dL (ref 3.5–5.0)
Alkaline Phosphatase: 46 U/L (ref 38–126)
Bilirubin, Direct: 0.1 mg/dL (ref 0.0–0.2)
Indirect Bilirubin: 0.6 mg/dL (ref 0.3–0.9)
Total Bilirubin: 0.7 mg/dL (ref 0.3–1.2)
Total Protein: 7.1 g/dL (ref 6.5–8.1)

## 2019-10-25 LAB — CBC
HCT: 42.9 % (ref 39.0–52.0)
Hemoglobin: 14.3 g/dL (ref 13.0–17.0)
MCH: 30.6 pg (ref 26.0–34.0)
MCHC: 33.3 g/dL (ref 30.0–36.0)
MCV: 91.9 fL (ref 80.0–100.0)
Platelets: 261 10*3/uL (ref 150–400)
RBC: 4.67 MIL/uL (ref 4.22–5.81)
RDW: 15.8 % — ABNORMAL HIGH (ref 11.5–15.5)
WBC: 5.7 10*3/uL (ref 4.0–10.5)
nRBC: 0 % (ref 0.0–0.2)

## 2019-10-25 LAB — LIPASE, BLOOD: Lipase: 20 U/L (ref 11–51)

## 2019-10-25 LAB — TROPONIN I (HIGH SENSITIVITY)
Troponin I (High Sensitivity): 7 ng/L (ref <18)
Troponin I (High Sensitivity): 8 ng/L (ref <18)

## 2019-10-25 MED ORDER — LISINOPRIL 10 MG PO TABS
10.0000 mg | ORAL_TABLET | Freq: Once | ORAL | Status: AC
  Administered 2019-10-25: 10 mg via ORAL
  Filled 2019-10-25: qty 1

## 2019-10-25 MED ORDER — LISINOPRIL 10 MG PO TABS: 10.0000 mg | ORAL_TABLET | Freq: Every day | ORAL | 1 refills | Status: DC

## 2019-10-25 MED ORDER — ALUM & MAG HYDROXIDE-SIMETH 200-200-20 MG/5ML PO SUSP
15.0000 mL | Freq: Once | ORAL | Status: AC
  Administered 2019-10-25: 13:00:00 15 mL via ORAL
  Filled 2019-10-25: qty 30

## 2019-10-25 MED ORDER — SUCRALFATE 1 GM/10ML PO SUSP: 1.0000 g | Freq: Four times a day (QID) | ORAL | 1 refills | Status: DC

## 2019-10-25 MED ORDER — LIDOCAINE VISCOUS HCL 2 % MT SOLN
15.0000 mL | Freq: Once | OROMUCOSAL | Status: AC
  Administered 2019-10-25: 15 mL via ORAL
  Filled 2019-10-25: qty 15

## 2019-10-25 NOTE — ED Triage Notes (Signed)
Pt reports CP to his right side chest, squeezing in nature and some SOB for about a week. Pt reports he felt like he pulled a muscle but it is not getting better.

## 2019-10-25 NOTE — ED Provider Notes (Signed)
[facility] Emergency Department Provider Note   ____________________________________________   First MD Initiated Contact with Patient 10/25/19 1211     (approximate)  I have reviewed the triage vital signs and the nursing notes.   HISTORY  Chief Complaint Chest Pain and Shortness of Breath    HPI Tony Calderon is a 59 y.o. male with possible history of hypertension and asthma who presents to the ED complaining of chest pain.  Patient reports he has been having occasional pain over his right lower chest and right upper quadrant of his abdomen for about the past week, much more severe earlier today.  He describes it as a squeezing that is not exacerbated or alleviated by anything in particular.  He does admit his appetite has been poor recently and he has been occasionally nauseous, but denies any vomiting.  He has not had any fevers or cough, but does state that he will feel short of breath when the pain is severe.  He admits to regular alcohol consumption, typically drinking a few beers a night with occasional liquor.  His last drink was last night and he denies any drug use.  He states he was previously on medication for his blood pressure, but that his PCP took him off of it.        Past Medical History:  Diagnosis Date  . Allergy   . Asthma   . Hypertension     Patient Active Problem List   Diagnosis Date Noted  . COPD exacerbation ([place]) 01/03/2017    Past Surgical History:  Procedure Laterality Date  . ANKLE ARTHROCENTESIS Right     Prior to Admission medications   Medication Sig Start Date End Date Taking? Authorizing Provider  ALPRAZolam (XANAX) 0.25 MG tablet Take 1 tablet (0.25 mg total) by mouth at bedtime as needed for anxiety. 08/09/19   [name], [name], PA-C  beclomethasone (QVAR) 40 MCG/ACT inhaler Inhale 1 puff into the lungs 2 (two) times daily. 01/04/17   Hillary Bow, MD  cyclobenzaprine (FLEXERIL) 5 MG tablet Take 1-2  tablets (5-10 mg total) by mouth 2 (two) times daily as needed for muscle spasms. 12/28/18   [name], [name], PA-C  diphenhydrAMINE (BENADRYL) 25 mg capsule Take 25 mg by mouth every 6 (six) hours as needed.     [provider]  Fluticasone-Salmeterol (ADVAIR DISKUS) 500-50 MCG/DOSE AEPB Inhale 1 puff into the lungs 2 (two) times daily. 08/09/19   [name], [name], PA-C  hydrOXYzine (ATARAX/VISTARIL) 25 MG tablet Take 1 tablet (25 mg total) by mouth every 6 (six) hours as needed for anxiety. 08/09/19   [name], [name], PA-C  loratadine (CLARITIN) 10 MG tablet Take 10 mg by mouth daily.    [provider]  Multiple Vitamin (MULTIVITAMIN) tablet Take 1 tablet by mouth daily.    [provider]  omeprazole (PRILOSEC) 20 MG capsule Take 1 capsule (20 mg total) by mouth daily. 03/08/18 04/07/18  Carrie Mew, MD  prednisoLONE acetate (PRED FORTE) 1 % ophthalmic suspension Place 1 drop into the right eye 4 (four) times daily. 12/13/18   Raylene Everts, MD  sucralfate (CARAFATE) 1 GM/10ML suspension Take 10 mLs (1 g total) by mouth 4 (four) times daily. 10/25/19 10/24/20  Blake Divine, MD    Allergies Patient has no known allergies.  Family History  Problem Relation Age of Onset  . Cancer Mother   . Hypertension Father   . Heart disease Father  Social History Social History   Tobacco Use  . Smoking status: Never Smoker  . Smokeless tobacco: Never Used  Substance Use Topics  . Alcohol use: Yes    Comment: occassionally  . Drug use: No    Review of Systems  Constitutional: No fever/chills Eyes: No visual changes. ENT: No sore throat. Cardiovascular: Positive for chest pain. Respiratory: Positive for shortness of breath. Gastrointestinal: No abdominal pain.  Positive for nausea, no vomiting.  No diarrhea.  No constipation. Genitourinary: Negative for dysuria. Musculoskeletal: Negative for back pain. Skin: Negative for rash. Neurological: Negative  for headaches, focal weakness or numbness.  ____________________________________________   PHYSICAL EXAM:  VITAL SIGNS: ED Triage Vitals  Enc Vitals Group     BP 10/25/19 1134 (!) 197/108     Pulse Rate 10/25/19 1134 73     Resp 10/25/19 1134 16     Temp 10/25/19 1134 97.6 F (36.4 C)     Temp Source 10/25/19 1134 Oral     SpO2 10/25/19 1134 99 %     Weight 10/25/19 1014 175 lb (79.4 kg)     Height 10/25/19 1014 5\' 9"  (1.753 m)     Head Circumference --      Peak Flow --      Pain Score 10/25/19 1013 8     Pain Loc --      Pain Edu? --      Excl. in GC? --     Constitutional: Alert and oriented. Eyes: Conjunctivae are normal. Head: Atraumatic. Nose: No congestion/rhinnorhea. Mouth/Throat: Mucous membranes are moist. Neck: Normal ROM Cardiovascular: Normal rate, regular rhythm. Grossly normal heart sounds. Respiratory: Normal respiratory effort.  No retractions. Lungs CTAB. Gastrointestinal: Soft and nontender. No distention. Genitourinary: deferred Musculoskeletal: No lower extremity tenderness nor edema. Neurologic:  Normal speech and language. No gross focal neurologic deficits are appreciated. Skin:  Skin is warm, dry and intact. No rash noted. Psychiatric: Mood and affect are normal. Speech and behavior are normal.  ____________________________________________   LABS (all labs ordered are listed, but only abnormal results are displayed)  Labs Reviewed  BASIC METABOLIC PANEL - Abnormal; Notable for the following components:      Result Value   Glucose, Bld 116 (*)    Calcium 8.6 (*)    All other components within normal limits  CBC - Abnormal; Notable for the following components:   RDW 15.8 (*)    All other components within normal limits  HEPATIC FUNCTION PANEL - Abnormal; Notable for the following components:   AST 95 (*)    ALT 55 (*)    All other components within normal limits  LIPASE, BLOOD  TROPONIN I (HIGH SENSITIVITY)  TROPONIN I (HIGH  SENSITIVITY)   ____________________________________________  EKG  ED ECG REPORT I, 10/27/19, the attending physician, personally viewed and interpreted this ECG.   Date: 10/25/2019  EKG Time: 10:22  Rate: 67  Rhythm: normal sinus rhythm  Axis: Normal  Intervals:left bundle branch block  ST&T Change: None `  PROCEDURES  Procedure(s) performed (including Critical Care):  Procedures   ____________________________________________   INITIAL IMPRESSION / ASSESSMENT AND PLAN / ED COURSE       59 year old male with history of hypertension, asthma, and alcohol abuse presents to the ED for intermittent right upper quadrant and right lower chest pain for the past week, more severe earlier today.  Pain is not reproducible with palpation of his chest or right upper quadrant and he states it has been easing  off on its own.  We will further assess with right upper quadrant ultrasound, LFTs, and lipase.  Cardiac work-up thus far has been negative with EKG showing no acute ischemic changes with patient's baseline left bundle branch block.  2 sets of troponin are negative and I doubt ACS given his heart score of less than 4.  Chest x-ray also negative for acute process.  If further testing is unremarkable, I suspect PUD versus alcoholic gastritis and we will start patient on PPI, treat with GI cocktail for now.  Patient reports feeling better following GI cocktail, right upper quadrant ultrasound is unremarkable, LFTs and lipase within normal limits.  Given unremarkable work-up, suspect PUD versus alcoholic gastritis.  Patient already takes a PPI, we will start on Carafate and he was advised to establish care with a PCP.  We will also restart him on lisinopril given his elevated blood pressure.  Patient counseled to return to the ED for new or worsening symptoms, patient agrees with plan.      ____________________________________________   FINAL CLINICAL IMPRESSION(S) / ED  DIAGNOSES  Final diagnoses:  RUQ pain  Atypical chest pain  Acute alcoholic gastritis without hemorrhage     ED Discharge Orders         Ordered    sucralfate (CARAFATE) 1 GM/10ML suspension  4 times daily     10/25/19 1448           Note:  This document was prepared using Dragon voice recognition software and may include unintentional dictation errors.   [name], MD 10/25/19 1450

## 2020-01-05 IMAGING — CR DG RIBS W/ CHEST 3+V*R*
1 series · 3 of 3 positions shown · non-contrast
Comparison: 01/03/2017 chest radiograph

CLINICAL DATA: Fall with rib pain

EXAM:
RIGHT RIBS AND CHEST - 3+ VIEW

[Series 1: dg ribs unilateral w/chest right · 0.14mm/px · 3 of 3 slices shown]
[im 1/3]
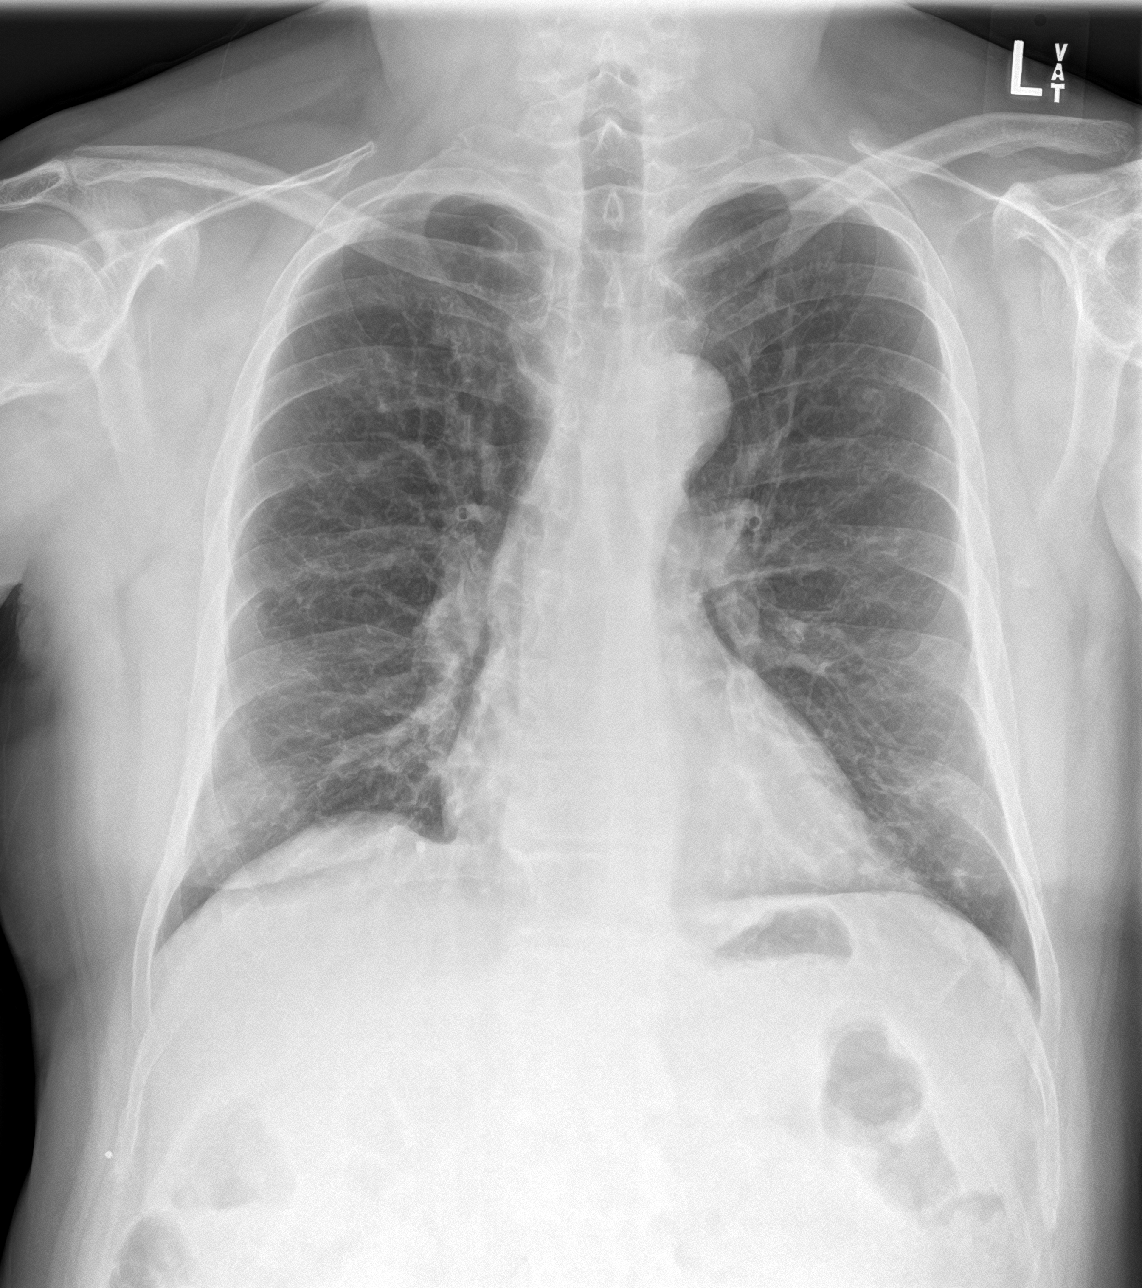
[im 2/3]
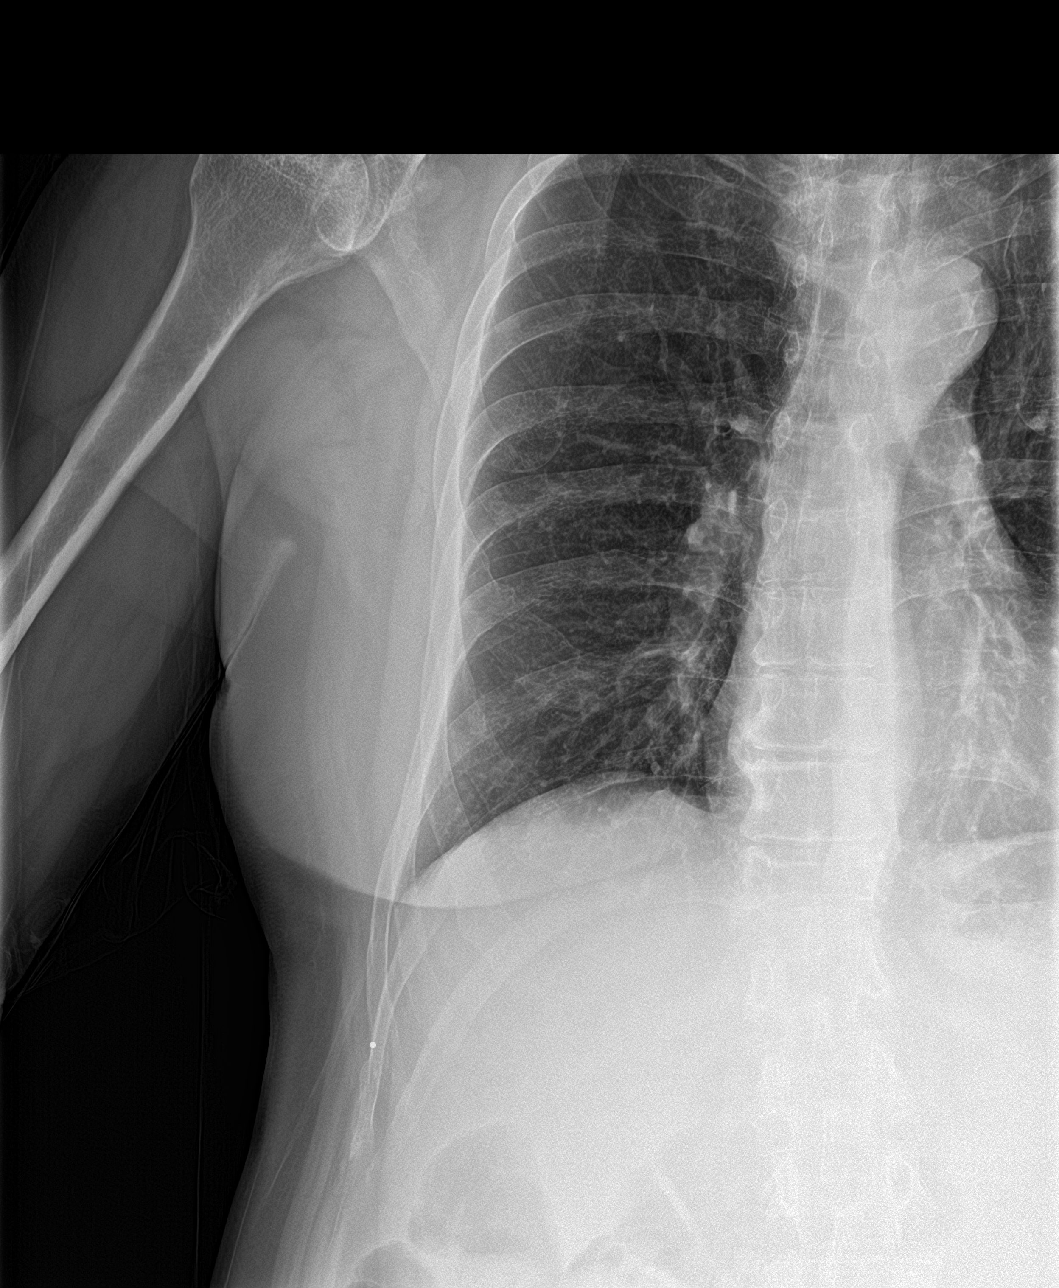
[im 3/3]
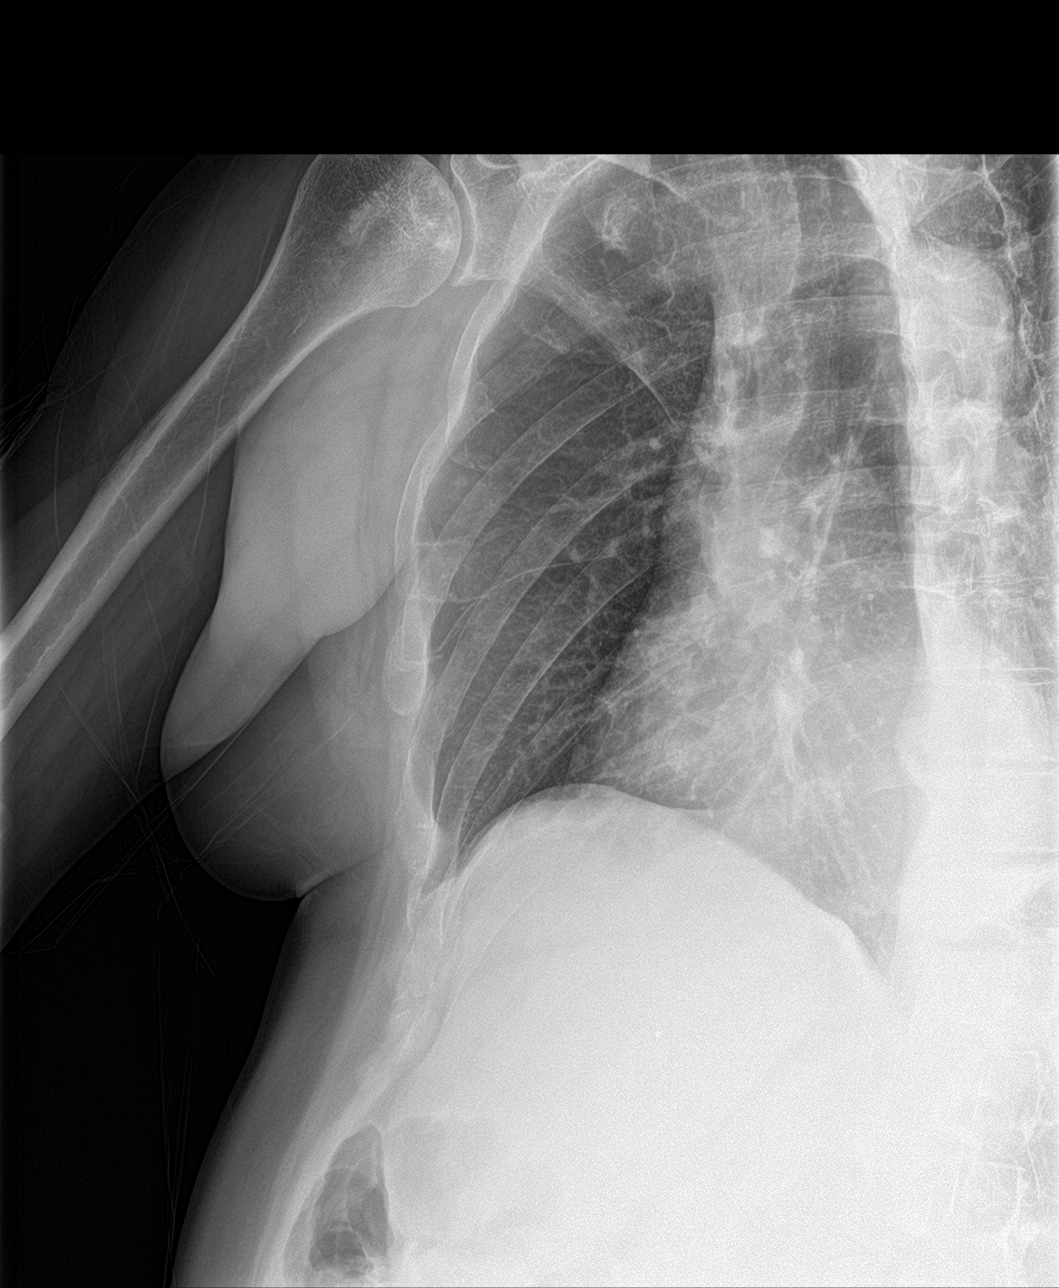

[3 of 3 positions shown; findings below may reference images not displayed]

FINDINGS: Minimally displaced fracture of the lateral aspect of the right
tenth rib. No other rib fractures. Heart size normal. Diffuse
interstitial coarsening without focal airspace consolidation or
pulmonary edema.
IMPRESSION: Minimally displaced fracture of the lateral right tenth rib.

## 2020-07-13 ENCOUNTER — Emergency Department (HOSPITAL_COMMUNITY)
Admission: EM | Admit: 2020-07-13 | Discharge: 2020-07-13 | Disposition: A | Discharge status: Home / Self Care | Payer: Self-pay | Attending: Emergency Medicine | Admitting: Emergency Medicine

## 2020-07-13 ENCOUNTER — Emergency Department (HOSPITAL_COMMUNITY): Payer: Self-pay

## 2020-07-13 DIAGNOSIS — I10 Essential (primary) hypertension: Secondary | ICD-10-CM | POA: Insufficient documentation

## 2020-07-13 DIAGNOSIS — Z7951 Long term (current) use of inhaled steroids: Secondary | ICD-10-CM | POA: Insufficient documentation

## 2020-07-13 DIAGNOSIS — J45909 Unspecified asthma, uncomplicated: Secondary | ICD-10-CM | POA: Insufficient documentation

## 2020-07-13 DIAGNOSIS — R55 Syncope and collapse: Secondary | ICD-10-CM | POA: Insufficient documentation

## 2020-07-13 DIAGNOSIS — Z79899 Other long term (current) drug therapy: Secondary | ICD-10-CM | POA: Insufficient documentation

## 2020-07-13 DIAGNOSIS — J441 Chronic obstructive pulmonary disease with (acute) exacerbation: Secondary | ICD-10-CM | POA: Insufficient documentation

## 2020-07-13 LAB — CBC WITH DIFFERENTIAL/PLATELET
Abs Immature Granulocytes: 0.07 10*3/uL (ref 0.00–0.07)
Basophils Absolute: 0.1 10*3/uL (ref 0.0–0.1)
Basophils Relative: 1 %
Eosinophils Absolute: 0.1 10*3/uL (ref 0.0–0.5)
Eosinophils Relative: 1 %
HCT: 44.1 % (ref 39.0–52.0)
Hemoglobin: 14.5 g/dL (ref 13.0–17.0)
Immature Granulocytes: 1 %
Lymphocytes Relative: 16 %
Lymphs Abs: 1 10*3/uL (ref 0.7–4.0)
MCH: 30.7 pg (ref 26.0–34.0)
MCHC: 32.9 g/dL (ref 30.0–36.0)
MCV: 93.4 fL (ref 80.0–100.0)
Monocytes Absolute: 0.6 10*3/uL (ref 0.1–1.0)
Monocytes Relative: 9 %
Neutro Abs: 4.6 10*3/uL (ref 1.7–7.7)
Neutrophils Relative %: 72 %
Platelets: 283 10*3/uL (ref 150–400)
RBC: 4.72 MIL/uL (ref 4.22–5.81)
RDW: 15.9 % — ABNORMAL HIGH (ref 11.5–15.5)
WBC: 6.4 10*3/uL (ref 4.0–10.5)
nRBC: 0 % (ref 0.0–0.2)

## 2020-07-13 LAB — BASIC METABOLIC PANEL
Anion gap: 15 (ref 5–15)
BUN: 8 mg/dL (ref 6–20)
CO2: 18 mmol/L — ABNORMAL LOW (ref 22–32)
Calcium: 8.6 mg/dL — ABNORMAL LOW (ref 8.9–10.3)
Chloride: 106 mmol/L (ref 98–111)
Creatinine, Ser: 0.83 mg/dL (ref 0.61–1.24)
GFR, Estimated: >60 mL/min (ref >60)
Glucose, Bld: 91 mg/dL (ref 70–99)
Potassium: 3.6 mmol/L (ref 3.5–5.1)
Sodium: 139 mmol/L (ref 135–145)

## 2020-07-13 LAB — CBG MONITORING, ED: Glucose-Capillary: 92 mg/dL (ref 70–99)

## 2020-07-13 LAB — TROPONIN I (HIGH SENSITIVITY): Troponin I (High Sensitivity): 13 ng/L (ref <18)

## 2020-07-13 LAB — ETHANOL: Alcohol, Ethyl (B): <10 mg/dL (ref <10)

## 2020-07-13 NOTE — ED Notes (Signed)
Patient ambulated to bathroom with stand by assist 

## 2020-07-13 NOTE — Discharge Instructions (Addendum)
Please return for any problem.  Follow-up with your regular care provider as instructed. °

## 2020-07-13 NOTE — ED Provider Notes (Addendum)
[facility] EMERGENCY DEPARTMENT Provider Note   CSN: [id number] Arrival date & time: 07/13/20  1810     History Chief Complaint  Patient presents with  . Loss of Consciousness    Tony Calderon is a 59 y.o. male.  59 year old male with prior medical history as detailed below presents for evaluation.  Patient arrives by EMS.  Patient was at [employer] store about the checkout.  Patient had an witnessed collapse.  Patient was caught by a bystander who assisted the patient to the floor.  Patient does not recall the event.  He denies preceding symptoms.  He recalls EMS arrival.  He is currently without complaint.  He denies associated chest pain or shortness of breath.  He denies injury related to the syncopal episode.  He denies prior history of syncope.  He admits that he does not have routine primary medical care.  Patient was not incontinent.  He did not bite his tongue.  No witnessed seizure-like activity.  No respiratory distress noted.  Patient's mental status rapidly returned to normal prior to EMS arrival on scene.  EMS reports that during transport the patient was noted to be significantly tachycardic into the 140s 150s.  Patient was given 6 mg of adenosine with gradual improvement in his heart rate back into the 90s.  Patient denies any symptoms during this episode.  EMS describes possible SVT.  The history is provided by the patient.  Loss of Consciousness Episode history:  Single Most recent episode:  Today Duration:  5 minutes Timing:  Unable to specify Progression:  Unable to specify Chronicity:  New Witnessed: yes   Relieved by:  Nothing Worsened by:  Nothing      Past Medical History:  Diagnosis Date  . Allergy   . Asthma   . Hypertension     Patient Active Problem List   Diagnosis Date Noted  . COPD exacerbation ([facility]) 01/03/2017    Past Surgical History:  Procedure Laterality Date  . ANKLE ARTHROCENTESIS Right         Family History  Problem Relation Age of Onset  . Cancer Mother   . Hypertension Father   . Heart disease Father     Social History   Tobacco Use  . Smoking status: Never Smoker  . Smokeless tobacco: Never Used  Vaping Use  . Vaping Use: Never used  Substance Use Topics  . Alcohol use: Yes    Comment: occassionally  . Drug use: No    Home Medications Prior to Admission medications   Medication Sig Start Date End Date Taking? Authorizing Provider  ALPRAZolam (XANAX) 0.25 MG tablet Take 1 tablet (0.25 mg total) by mouth at bedtime as needed for anxiety. 08/09/19   [name], [name], PA-C  beclomethasone (QVAR) 40 MCG/ACT inhaler Inhale 1 puff into the lungs 2 (two) times daily. 01/04/17   Milagros Loll, MD  cyclobenzaprine (FLEXERIL) 5 MG tablet Take 1-2 tablets (5-10 mg total) by mouth 2 (two) times daily as needed for muscle spasms. 12/28/18   [name], [name], PA-C  diphenhydrAMINE (BENADRYL) 25 mg capsule Take 25 mg by mouth every 6 (six) hours as needed.     [provider]  Fluticasone-Salmeterol (ADVAIR DISKUS) 500-50 MCG/DOSE AEPB Inhale 1 puff into the lungs 2 (two) times daily. 08/09/19   [name], [name], PA-C  hydrOXYzine (ATARAX/VISTARIL) 25 MG tablet Take 1 tablet (25 mg total) by mouth every 6 (six) hours as needed for anxiety. 08/09/19  [name], [name], PA-C  lisinopril (ZESTRIL) 10 MG tablet Take 1 tablet (10 mg total) by mouth daily. 10/25/19 12/24/19  [name], MD  loratadine (CLARITIN) 10 MG tablet Take 10 mg by mouth daily.    [provider]  Multiple Vitamin (MULTIVITAMIN) tablet Take 1 tablet by mouth daily.    [provider]  omeprazole (PRILOSEC) 20 MG capsule Take 1 capsule (20 mg total) by mouth daily. 03/08/18 04/07/18  [name], MD  prednisoLONE acetate (PRED FORTE) 1 % ophthalmic suspension Place 1 drop into the right eye 4 (four) times daily. 12/13/18   [name], MD  sucralfate (CARAFATE) 1 GM/10ML  suspension Take 10 mLs (1 g total) by mouth 4 (four) times daily. 10/25/19 10/24/20  [name], MD    Allergies    Patient has no known allergies.  Review of Systems   Review of Systems  Cardiovascular: Positive for syncope.  All other systems reviewed and are negative.   Physical Exam Updated Vital Signs BP (!) 179/104 (BP Location: Right Arm)   Pulse 89   Temp (!) 97.5 F (36.4 C) (Oral)   Resp 17   Physical Exam Vitals and nursing note reviewed.  Constitutional:      General: He is not in acute distress.    Appearance: Normal appearance. He is well-developed and well-nourished.  HENT:     Head: Normocephalic and atraumatic.     Mouth/Throat:     Mouth: Oropharynx is clear and moist.  Eyes:     Extraocular Movements: EOM normal.     Conjunctiva/sclera: Conjunctivae normal.     Pupils: Pupils are equal, round, and reactive to light.  Cardiovascular:     Rate and Rhythm: Normal rate and regular rhythm.     Heart sounds: Normal heart sounds.  Pulmonary:     Effort: Pulmonary effort is normal. No respiratory distress.     Breath sounds: Normal breath sounds.  Abdominal:     General: There is no distension.     Palpations: Abdomen is soft.     Tenderness: There is no abdominal tenderness.  Musculoskeletal:        General: No deformity or edema. Normal range of motion.     Cervical back: Normal range of motion and neck supple.  Skin:    General: Skin is warm and dry.  Neurological:     General: No focal deficit present.     Mental Status: He is alert and oriented to person, place, and time.  Psychiatric:        Mood and Affect: Mood and affect normal.     ED Results / Procedures / Treatments   Labs (all labs ordered are listed, but only abnormal results are displayed) Labs Reviewed  BASIC METABOLIC PANEL - Abnormal; Notable for the following components:      Result Value   CO2 18 (*)    Calcium 8.6 (*)    All other components within normal limits  CBC  WITH DIFFERENTIAL/PLATELET - Abnormal; Notable for the following components:   RDW 15.9 (*)    All other components within normal limits  ETHANOL  CBG MONITORING, ED  TROPONIN I (HIGH SENSITIVITY)  TROPONIN I (HIGH SENSITIVITY)    EKG EKG Interpretation  Date/Time:  Friday July 13 2020 18:17:38 EST Ventricular Rate:  97 PR Interval:    QRS Duration: 156 QT Interval:  396 QTC Calculation: 504 R Axis:   35 Text Interpretation: Sinus rhythm Left bundle branch block  Confirmed by Kristine Royal [phone]) on 07/13/2020 6:19:31 PM   Radiology DG Chest Port 1 View  Result Date: 07/13/2020 CLINICAL DATA:  Syncope. EXAM: PORTABLE CHEST 1 VIEW COMPARISON:  October 25, 2019. FINDINGS: The heart size and mediastinal contours are within normal limits. Both lungs are clear. The visualized skeletal structures are unremarkable. IMPRESSION: No active disease. Electronically Signed   By: Lupita Raider M.D.   On: 07/13/2020 18:36    Procedures Procedures (including critical care time)  Medications Ordered in ED Medications - No data to display  ED Course  I have reviewed the triage vital signs and the nursing notes.  Pertinent labs & imaging results that were available during my care of the patient were reviewed by me and considered in my medical decision making (see chart for details).    MDM Rules/Calculators/A&P                          MDM  Screen complete  CABE LASHLEY Calderon was evaluated in Emergency Department on 07/13/2020 for the symptoms described in the history of present illness. He was evaluated in the context of the global COVID-19 pandemic, which necessitated consideration that the patient might be at risk for infection with the SARS-CoV-2 virus that causes COVID-19. Institutional protocols and algorithms that pertain to the evaluation of patients at risk for COVID-19 are in a state of rapid change based on information released by regulatory bodies including the CDC and  federal and state organizations. These policies and algorithms were followed during the patient's care in the ED.   Patient is presenting for evaluation following reported near syncopal or syncopal event.  Patient without witnessed seizure activity or likely sequela of seizure (incontinence, tongue biting, post-ictal state, etc.  Work-up in the ED was without significant findings.  Patient felt significantly improved.  He now desires discharge.  Patient does understand need for close follow-up.  EMS reported possible SVT.  During period of observation no evidence of arrhythmia found. Patient declined further observation or admission.   Importance of close follow up is stressed. Strict return precautions given and understood.     Final Clinical Impression(s) / ED Diagnoses Final diagnoses:  Syncope, unspecified syncope type    Rx / DC Orders ED Discharge Orders    None       Wynetta Fines, MD 07/13/20 2110    Wynetta Fines, MD 07/13/20 2138

## 2020-07-13 NOTE — ED Triage Notes (Signed)
Pt checking out at [facility]. Witnessed "twitching then passed out". Pt was assisted to the floor. Pt was unresponsive. MEDIC found pt with HR 150-160. Adenosine 6mg  IV given. Pt arrived to ED in NSR with LBBB. Denies any pain or SOB at this time.

## 2020-10-16 ENCOUNTER — Emergency Department (HOSPITAL_COMMUNITY)
Admission: EM | Admit: 2020-10-16 | Discharge: 2020-10-16 | Disposition: A | Discharge status: Home / Self Care | Payer: Self-pay | Attending: Emergency Medicine | Admitting: Emergency Medicine

## 2020-10-16 ENCOUNTER — Other Ambulatory Visit: Payer: Self-pay

## 2020-10-16 ENCOUNTER — Encounter (HOSPITAL_COMMUNITY): Payer: Self-pay | Admitting: Emergency Medicine

## 2020-10-16 ENCOUNTER — Emergency Department (HOSPITAL_COMMUNITY): Payer: Self-pay

## 2020-10-16 DIAGNOSIS — Z7951 Long term (current) use of inhaled steroids: Secondary | ICD-10-CM | POA: Insufficient documentation

## 2020-10-16 DIAGNOSIS — R000 Tachycardia, unspecified: Secondary | ICD-10-CM | POA: Insufficient documentation

## 2020-10-16 DIAGNOSIS — R251 Tremor, unspecified: Secondary | ICD-10-CM | POA: Insufficient documentation

## 2020-10-16 DIAGNOSIS — Y905 Blood alcohol level of 100-119 mg/100 ml: Secondary | ICD-10-CM | POA: Insufficient documentation

## 2020-10-16 DIAGNOSIS — F10230 Alcohol dependence with withdrawal, uncomplicated: Secondary | ICD-10-CM

## 2020-10-16 DIAGNOSIS — J45909 Unspecified asthma, uncomplicated: Secondary | ICD-10-CM | POA: Insufficient documentation

## 2020-10-16 DIAGNOSIS — F1490 Cocaine use, unspecified, uncomplicated: Secondary | ICD-10-CM | POA: Insufficient documentation

## 2020-10-16 DIAGNOSIS — I10 Essential (primary) hypertension: Secondary | ICD-10-CM | POA: Insufficient documentation

## 2020-10-16 DIAGNOSIS — F1290 Cannabis use, unspecified, uncomplicated: Secondary | ICD-10-CM | POA: Insufficient documentation

## 2020-10-16 DIAGNOSIS — F10129 Alcohol abuse with intoxication, unspecified: Secondary | ICD-10-CM | POA: Insufficient documentation

## 2020-10-16 DIAGNOSIS — Z79899 Other long term (current) drug therapy: Secondary | ICD-10-CM | POA: Insufficient documentation

## 2020-10-16 DIAGNOSIS — J441 Chronic obstructive pulmonary disease with (acute) exacerbation: Secondary | ICD-10-CM | POA: Insufficient documentation

## 2020-10-16 DIAGNOSIS — F10930 Alcohol use, unspecified with withdrawal, uncomplicated: Secondary | ICD-10-CM

## 2020-10-16 HISTORY — DX: Unspecified convulsions: R56.9

## 2020-10-16 LAB — URINALYSIS, ROUTINE W REFLEX MICROSCOPIC
Bacteria, UA: NONE SEEN
Bilirubin Urine: NEGATIVE
Glucose, UA: NEGATIVE mg/dL
Hgb urine dipstick: NEGATIVE
Ketones, ur: 5 mg/dL — AB
Leukocytes,Ua: NEGATIVE
Nitrite: NEGATIVE
Protein, ur: 30 mg/dL — AB
Specific Gravity, Urine: 1.023 (ref 1.005–1.030)
pH: 6 (ref 5.0–8.0)

## 2020-10-16 LAB — RAPID URINE DRUG SCREEN, HOSP PERFORMED
Amphetamines: NOT DETECTED
Barbiturates: NOT DETECTED
Benzodiazepines: NOT DETECTED
Cocaine: POSITIVE — AB
Opiates: NOT DETECTED
Tetrahydrocannabinol: POSITIVE — AB

## 2020-10-16 LAB — COMPREHENSIVE METABOLIC PANEL
ALT: 49 U/L — ABNORMAL HIGH (ref 0–44)
AST: 138 U/L — ABNORMAL HIGH (ref 15–41)
Albumin: 3.7 g/dL (ref 3.5–5.0)
Alkaline Phosphatase: 59 U/L (ref 38–126)
Anion gap: 12 (ref 5–15)
BUN: 6 mg/dL (ref 6–20)
CO2: 22 mmol/L (ref 22–32)
Calcium: 9.6 mg/dL (ref 8.9–10.3)
Chloride: 103 mmol/L (ref 98–111)
Creatinine, Ser: 0.84 mg/dL (ref 0.61–1.24)
GFR, Estimated: >60 mL/min (ref >60)
Glucose, Bld: 100 mg/dL — ABNORMAL HIGH (ref 70–99)
Potassium: 3.8 mmol/L (ref 3.5–5.1)
Sodium: 137 mmol/L (ref 135–145)
Total Bilirubin: 1 mg/dL (ref 0.3–1.2)
Total Protein: 7.2 g/dL (ref 6.5–8.1)

## 2020-10-16 LAB — CBC
HCT: 41.4 % (ref 39.0–52.0)
Hemoglobin: 14 g/dL (ref 13.0–17.0)
MCH: 30.4 pg (ref 26.0–34.0)
MCHC: 33.8 g/dL (ref 30.0–36.0)
MCV: 89.8 fL (ref 80.0–100.0)
Platelets: 259 10*3/uL (ref 150–400)
RBC: 4.61 MIL/uL (ref 4.22–5.81)
RDW: 16.8 % — ABNORMAL HIGH (ref 11.5–15.5)
WBC: 6.2 10*3/uL (ref 4.0–10.5)
nRBC: 0 % (ref 0.0–0.2)

## 2020-10-16 LAB — ETHANOL: Alcohol, Ethyl (B): 11 mg/dL — ABNORMAL HIGH (ref <10)

## 2020-10-16 MED ORDER — LISINOPRIL 10 MG PO TABS
10.0000 mg | ORAL_TABLET | Freq: Once | ORAL | Status: AC
  Administered 2020-10-16: 10 mg via ORAL
  Filled 2020-10-16: qty 1

## 2020-10-16 MED ORDER — LORAZEPAM 2 MG/ML IJ SOLN
2.0000 mg | Freq: Once | INTRAMUSCULAR | Status: AC
  Administered 2020-10-16: 2 mg via INTRAVENOUS
  Filled 2020-10-16: qty 1

## 2020-10-16 MED ORDER — CHLORDIAZEPOXIDE HCL 25 MG PO CAPS: ORAL_CAPSULE | ORAL | 0 refills | Status: DC

## 2020-10-16 MED ORDER — SODIUM CHLORIDE 0.9 % IV BOLUS
1000.0000 mL | Freq: Once | INTRAVENOUS | Status: AC
  Administered 2020-10-16: 1000 mL via INTRAVENOUS

## 2020-10-16 NOTE — ED Notes (Signed)
Dc instructions reviewed with the pt.  PT verbalized understanding.  Pt DC.  

## 2020-10-16 NOTE — ED Triage Notes (Signed)
Pt to triage via [facility] from a parking lot.  Pt reports having a seizure a few months ago and felt an auro prior to seizure.  States he has an auro today- states he feels shaky and just doesn't feel good.  Only history of 1 seizure in the past.  Reports heavy, daily ETOH use.  Last ETOH last night.

## 2020-10-16 NOTE — Discharge Instructions (Signed)
Follow-up with the wellness clinic.  Please stop drinking alcohol.  Take Librium to prevent seizures and withdrawal symptoms.  Please take your lisinopril 10 mg daily for high blood pressure.  Follow-up with the clinic to recheck your blood pressure in a week.  Return to ER if you have seizure, hallucinations, tremors, dizziness, fevers.

## 2020-10-16 NOTE — ED Provider Notes (Signed)
[facility] EMERGENCY DEPARTMENT Provider Note   CSN: [id number] Arrival date & time: 10/16/20  1531     History Chief Complaint  Patient presents with  . Seizure auro  . Alcohol Problem    Tony Calderon is a 60 y.o. male hx of HTN, alcohol withdrawal seizures, chronic alcohol abuse, here presenting with R and possible seizure.  Patient states that he does drink alcohol daily and last drink was yesterday.  Patient also admits to using cocaine last night.  He states that he was driving to the job site today and had an aura.  He also feels shaky and has tremors.  Denies any hallucinations or actual seizures.  He states that he had alcohol withdrawal seizures several months back.  Patient states that he was seen in the ED and was not admitted to the hospital at that time.  The history is provided by the patient.       Past Medical History:  Diagnosis Date  . Allergy   . Asthma   . Hypertension   . Seizures [facility])     Patient Active Problem List   Diagnosis Date Noted  . COPD exacerbation (HCC) 01/03/2017    Past Surgical History:  Procedure Laterality Date  . ANKLE ARTHROCENTESIS Right        Family History  Problem Relation Age of Onset  . Cancer Mother   . Hypertension Father   . Heart disease Father     Social History   Tobacco Use  . Smoking status: Never Smoker  . Smokeless tobacco: Never Used  Vaping Use  . Vaping Use: Never used  Substance Use Topics  . Alcohol use: Yes  . Drug use: No    Home Medications Prior to Admission medications   Medication Sig Start Date End Date Taking? Authorizing Provider  ALPRAZolam (XANAX) 0.25 MG tablet Take 1 tablet (0.25 mg total) by mouth at bedtime as needed for anxiety. 08/09/19   [name], [name], PA-C  beclomethasone (QVAR) 40 MCG/ACT inhaler Inhale 1 puff into the lungs 2 (two) times daily. 01/04/17   Milagros Loll, MD  cyclobenzaprine (FLEXERIL) 5 MG tablet Take 1-2 tablets (5-10 mg  total) by mouth 2 (two) times daily as needed for muscle spasms. 12/28/18   [name], [name], PA-C  diphenhydrAMINE (BENADRYL) 25 mg capsule Take 25 mg by mouth every 6 (six) hours as needed.     [provider]  Fluticasone-Salmeterol (ADVAIR DISKUS) 500-50 MCG/DOSE AEPB Inhale 1 puff into the lungs 2 (two) times daily. 08/09/19   [name], [name], PA-C  hydrOXYzine (ATARAX/VISTARIL) 25 MG tablet Take 1 tablet (25 mg total) by mouth every 6 (six) hours as needed for anxiety. 08/09/19   [name], [name], PA-C  lisinopril (ZESTRIL) 10 MG tablet Take 1 tablet (10 mg total) by mouth daily. 10/25/19 12/24/19  [name], MD  loratadine (CLARITIN) 10 MG tablet Take 10 mg by mouth daily.    [provider]  Multiple Vitamin (MULTIVITAMIN) tablet Take 1 tablet by mouth daily.    [provider]  omeprazole (PRILOSEC) 20 MG capsule Take 1 capsule (20 mg total) by mouth daily. 03/08/18 04/07/18  [name], MD  prednisoLONE acetate (PRED FORTE) 1 % ophthalmic suspension Place 1 drop into the right eye 4 (four) times daily. 12/13/18   [name], MD  sucralfate (CARAFATE) 1 GM/10ML suspension Take 10 mLs (1 g total) by mouth 4 (four) times daily. 10/25/19 10/24/20  Jessup,  Leonette Most, MD    Allergies    Patient has no known allergies.  Review of Systems   Review of Systems  Neurological: Positive for dizziness and tremors.  All other systems reviewed and are negative.   Physical Exam Updated Vital Signs BP (!) 152/91   Pulse 70   Temp 99.1 F (37.3 C)   Resp 10   Ht 5\' 7"  (1.702 m)   Wt 72.6 kg   SpO2 98%   BMI 25.06 kg/m   Physical Exam Vitals and nursing note reviewed.  Constitutional:      Comments: Tremors and patient is awake and alert  HENT:     Head: Normocephalic.     Nose: Nose normal.     Mouth/Throat:     Mouth: Mucous membranes are dry.  Eyes:     Extraocular Movements: Extraocular movements intact.     Pupils: Pupils are equal, round,  and reactive to light.  Cardiovascular:     Rate and Rhythm: Regular rhythm. Tachycardia present.     Pulses: Normal pulses.     Heart sounds: Normal heart sounds.  Pulmonary:     Effort: Pulmonary effort is normal.     Breath sounds: Normal breath sounds.  Abdominal:     General: Abdomen is flat.     Palpations: Abdomen is soft.  Musculoskeletal:        General: Normal range of motion.     Cervical back: Normal range of motion and neck supple.  Skin:    General: Skin is warm.     Capillary Refill: Capillary refill takes less than 2 seconds.  Neurological:     General: No focal deficit present.     Comments: Cranial nerves II to XII is intact.  Patient has resting tremors.  Patient has no obvious eye deviation.  Psychiatric:        Mood and Affect: Mood normal.        Behavior: Behavior normal.     ED Results / Procedures / Treatments   Labs (all labs ordered are listed, but only abnormal results are displayed) Labs Reviewed  COMPREHENSIVE METABOLIC PANEL - Abnormal; Notable for the following components:      Result Value   Glucose, Bld 100 (*)    AST 138 (*)    ALT 49 (*)    All other components within normal limits  ETHANOL - Abnormal; Notable for the following components:   Alcohol, Ethyl (B) 11 (*)    All other components within normal limits  CBC - Abnormal; Notable for the following components:   RDW 16.8 (*)    All other components within normal limits  RAPID URINE DRUG SCREEN, HOSP PERFORMED - Abnormal; Notable for the following components:   Cocaine POSITIVE (*)    Tetrahydrocannabinol POSITIVE (*)    All other components within normal limits  URINALYSIS, ROUTINE W REFLEX MICROSCOPIC - Abnormal; Notable for the following components:   Color, Urine AMBER (*)    Ketones, ur 5 (*)    Protein, ur 30 (*)    All other components within normal limits    EKG EKG Interpretation  Date/Time:  Tuesday October 16 2020 15:35:34 EDT Ventricular Rate:  78 PR  Interval:  176 QRS Duration: 148 QT Interval:  428 QTC Calculation: 487 R Axis:   7 Text Interpretation: Normal sinus rhythm Left bundle branch block Abnormal ECG No sig chance from Jul 13 2020 ecg Confirmed by 07-09-2005 [phone]) on 10/16/2020 3:47:49 PM  Radiology CT Head Wo Contrast  Result Date: 10/16/2020 CLINICAL DATA:  Seizure, nontraumatic. EXAM: CT HEAD WITHOUT CONTRAST TECHNIQUE: Contiguous axial images were obtained from the base of the skull through the vertex without intravenous contrast. COMPARISON:  June 30, two thousand six FINDINGS: Brain: No evidence of acute large vascular territory infarction, hemorrhage, hydrocephalus, extra-axial collection or mass lesion/mass effect. Mild scattered white matter hypodensities, nonspecific but most likely related to chronic microvascular ischemic disease. Vascular: No hyperdense vessel identified. Skull: No acute fracture. Sinuses/Orbits: Mild ethmoid air cell mucosal thickening. No air-fluid levels. Unremarkable orbits. Other: No mastoid effusions. IMPRESSION: No CT evidence of acute intracranial abnormality. If the patient's seizures continue, consider epilepsy protocol MRI for more sensitive evaluation for underlying epileptogenic abnormality. Electronically Signed   By: Feliberto Harts MD   On: 10/16/2020 18:30    Procedures Procedures   Medications Ordered in ED Medications  lisinopril (ZESTRIL) tablet 10 mg (has no administration in time range)  sodium chloride 0.9 % bolus 1,000 mL (0 mLs Intravenous Stopped 10/16/20 1911)  LORazepam (ATIVAN) injection 2 mg (2 mg Intravenous Given 10/16/20 1625)    ED Course  I have reviewed the triage vital signs and the nursing notes.  Pertinent labs & imaging results that were available during my care of the patient were reviewed by me and considered in my medical decision making (see chart for details).    MDM Rules/Calculators/A&P                         TIYON SANOR Calderon is a 60  y.o. male here presenting with tremors.  His CIWA is 11.  He admits to using alcohol.  I wonder if he has tremors from alcohol withdrawal or he is intoxicated.  Also consider electrolyte abnormality.  Plan to get CBC and CMP and UDS and alcohol level. Will hydrate and give ativan.   8:24 PM Patient's UDS is positive cocaine and marijuana.  LFTs slightly elevated and alcohol level is 11.  Patient's blood pressure is down to 150s from 180s.  Patient is not tachycardic.  Since he did not actually have a seizure and better, I think it is reasonable to discharge patient home with Librium taper.  Patient also wants resources for detox.  Patient does not have a primary care doctor and will refer to the wellness clinic.  Patient actually had lisinopril prescriptions before and I told him to take it.     Final Clinical Impression(s) / ED Diagnoses Final diagnoses:  None    Rx / DC Orders ED Discharge Orders    None       Charlynne Pander, MD 10/16/20 2026

## 2020-11-23 ENCOUNTER — Inpatient Hospital Stay: Payer: Self-pay | Admitting: Internal Medicine

## 2021-08-23 IMAGING — US US ABDOMEN LIMITED
1 series · 14 of 25 positions shown · non-contrast
Comparison: None.

CLINICAL DATA: Right upper quadrant pain for 1 week

EXAM:
ULTRASOUND ABDOMEN LIMITED RIGHT UPPER QUADRANT

[Series 1: us abdomen limited ruq · 14 of 34 slices shown]
[im 1/34]
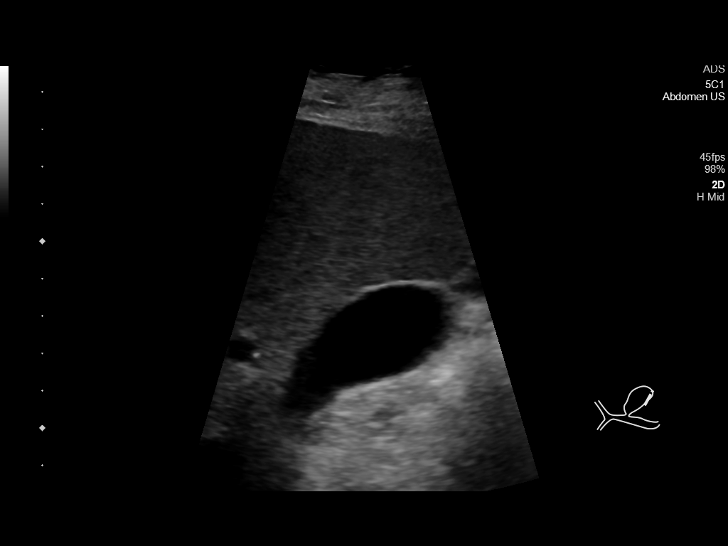
[im 3/34]
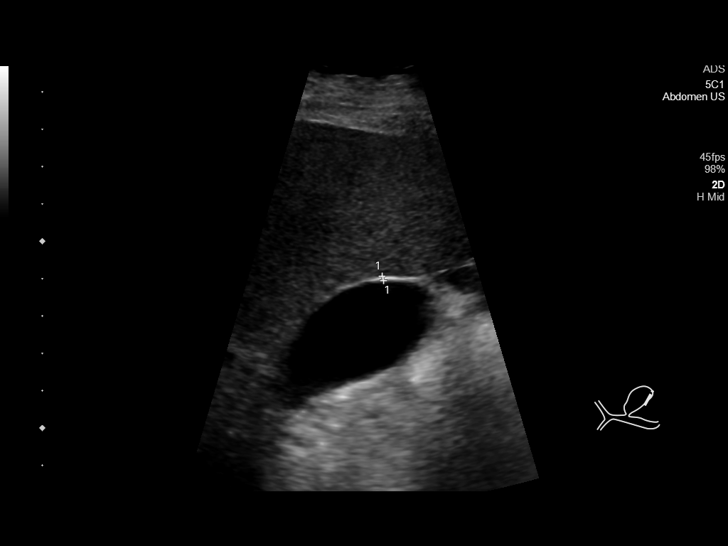
[im 6/34]
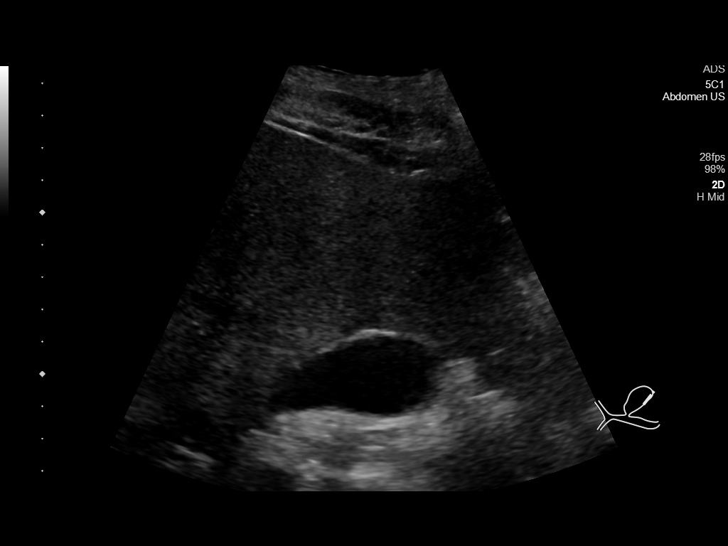
[im 9/34]
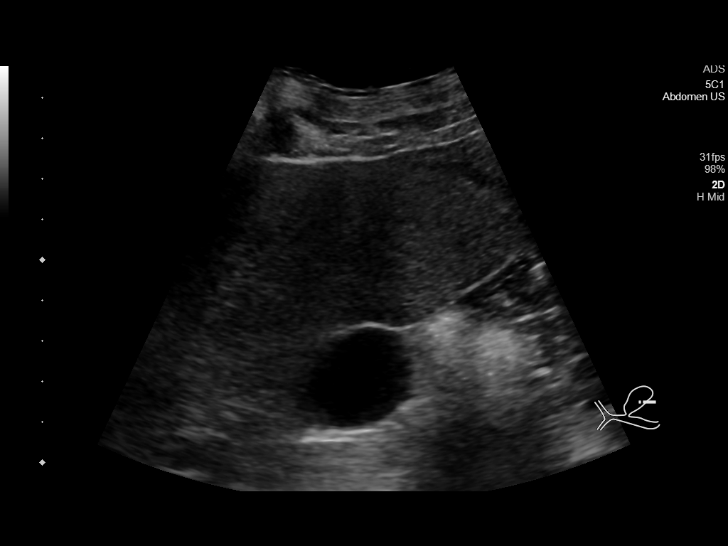
[im 12/34]
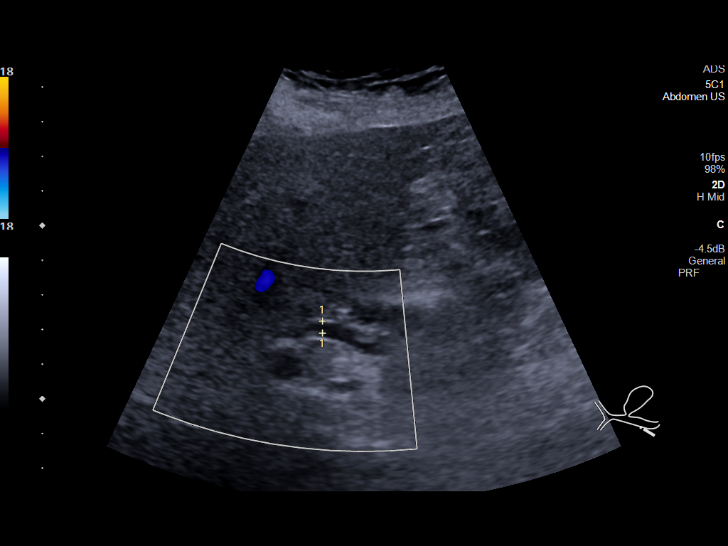
[im 13/34]
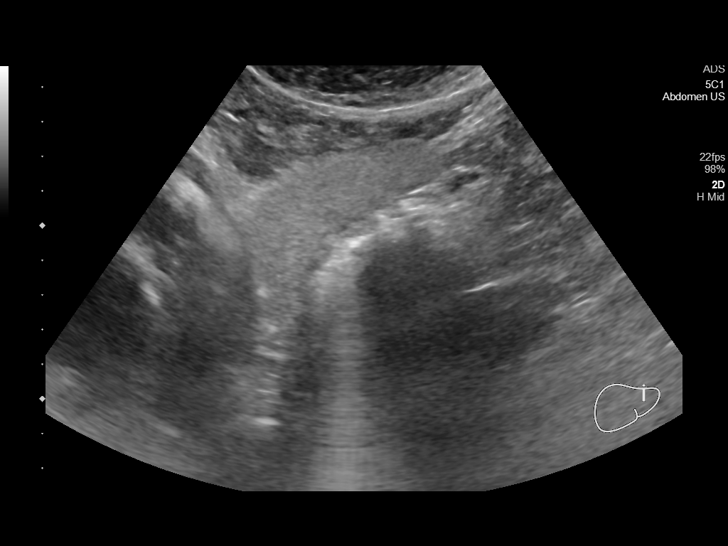
[im 16/34]
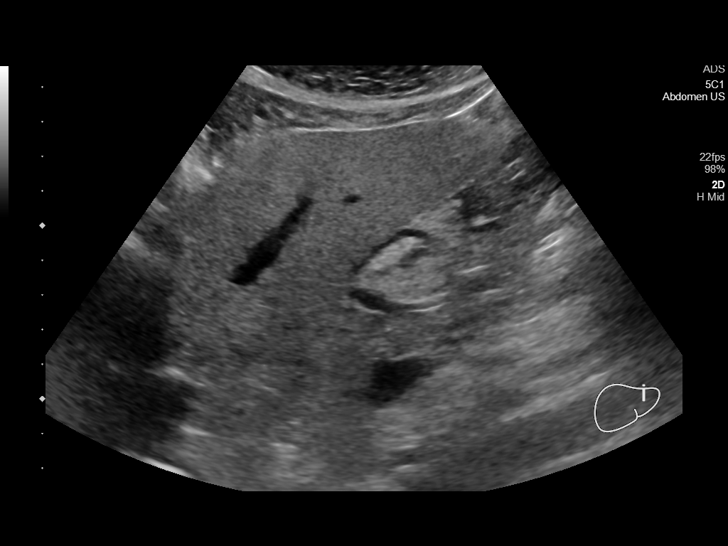
[im 18/34]
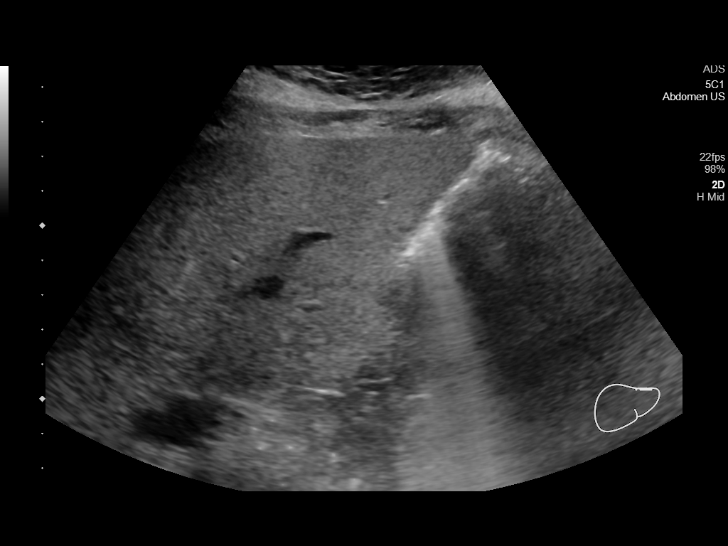
[im 21/34]
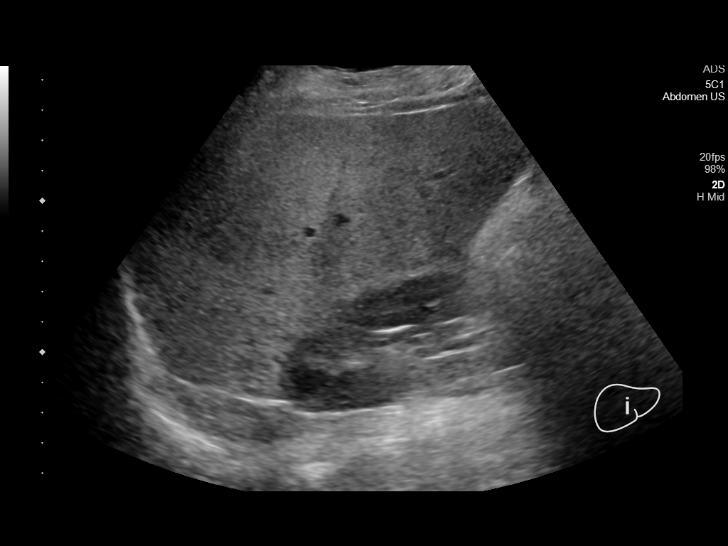
[im 23/34]
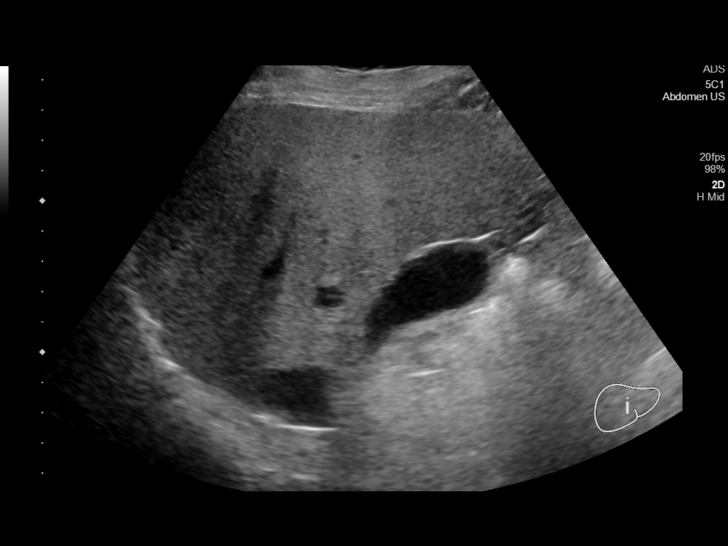
[im 25/34]
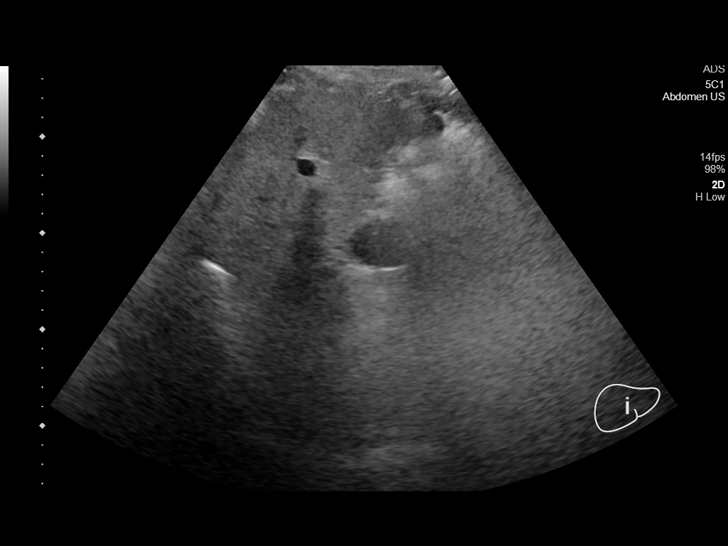
[im 28/34]
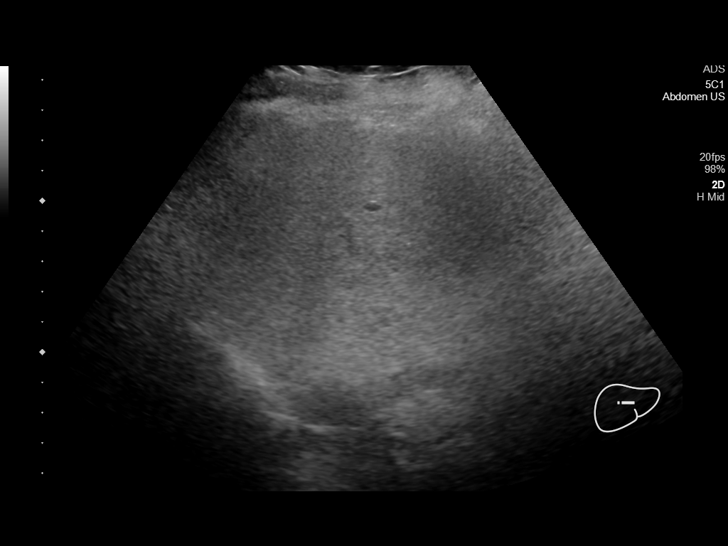
[im 31/34]
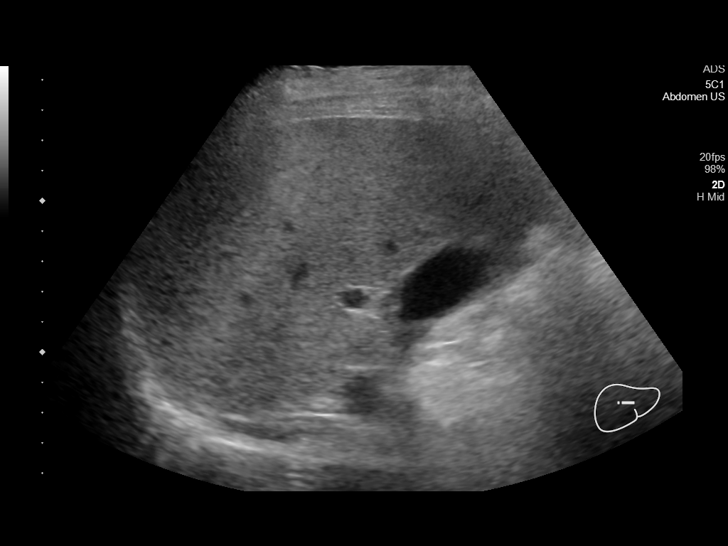
[im 34/34]
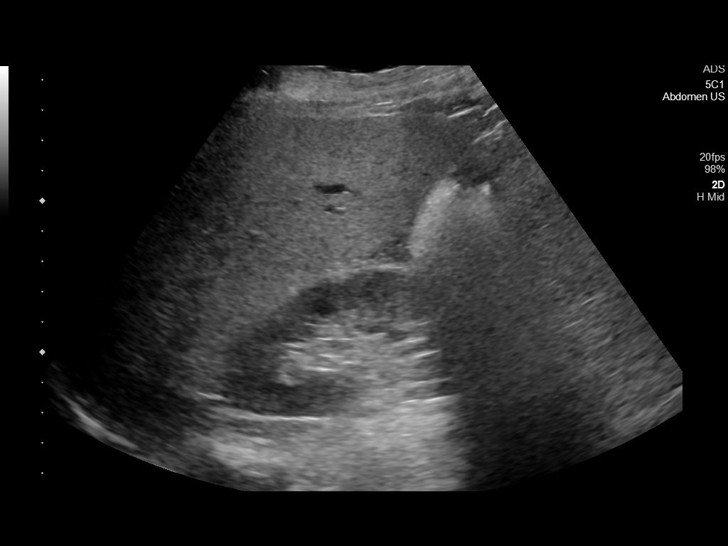

[14 of 25 positions shown; findings below may reference images not displayed]

FINDINGS: Gallbladder:

No gallstones or wall thickening visualized. No sonographic Murphy
sign noted by sonographer.

Common bile duct:

Diameter: 4 mm

Liver:

There is diffuse increased liver echotexture consistent with fatty
infiltration. No focal abnormalities. Portal vein is patent on color
Doppler imaging with normal direction of blood flow towards the
liver.

Other: None.
IMPRESSION: 1. Diffuse fatty infiltration of the liver.
2. Otherwise unremarkable exam.

## 2022-05-12 IMAGING — DX DG CHEST 1V PORT
1 series · 1 of 1 positions shown · non-contrast
Comparison: October 25, 2019.

CLINICAL DATA: Syncope.

EXAM:
PORTABLE CHEST 1 VIEW

[chest]
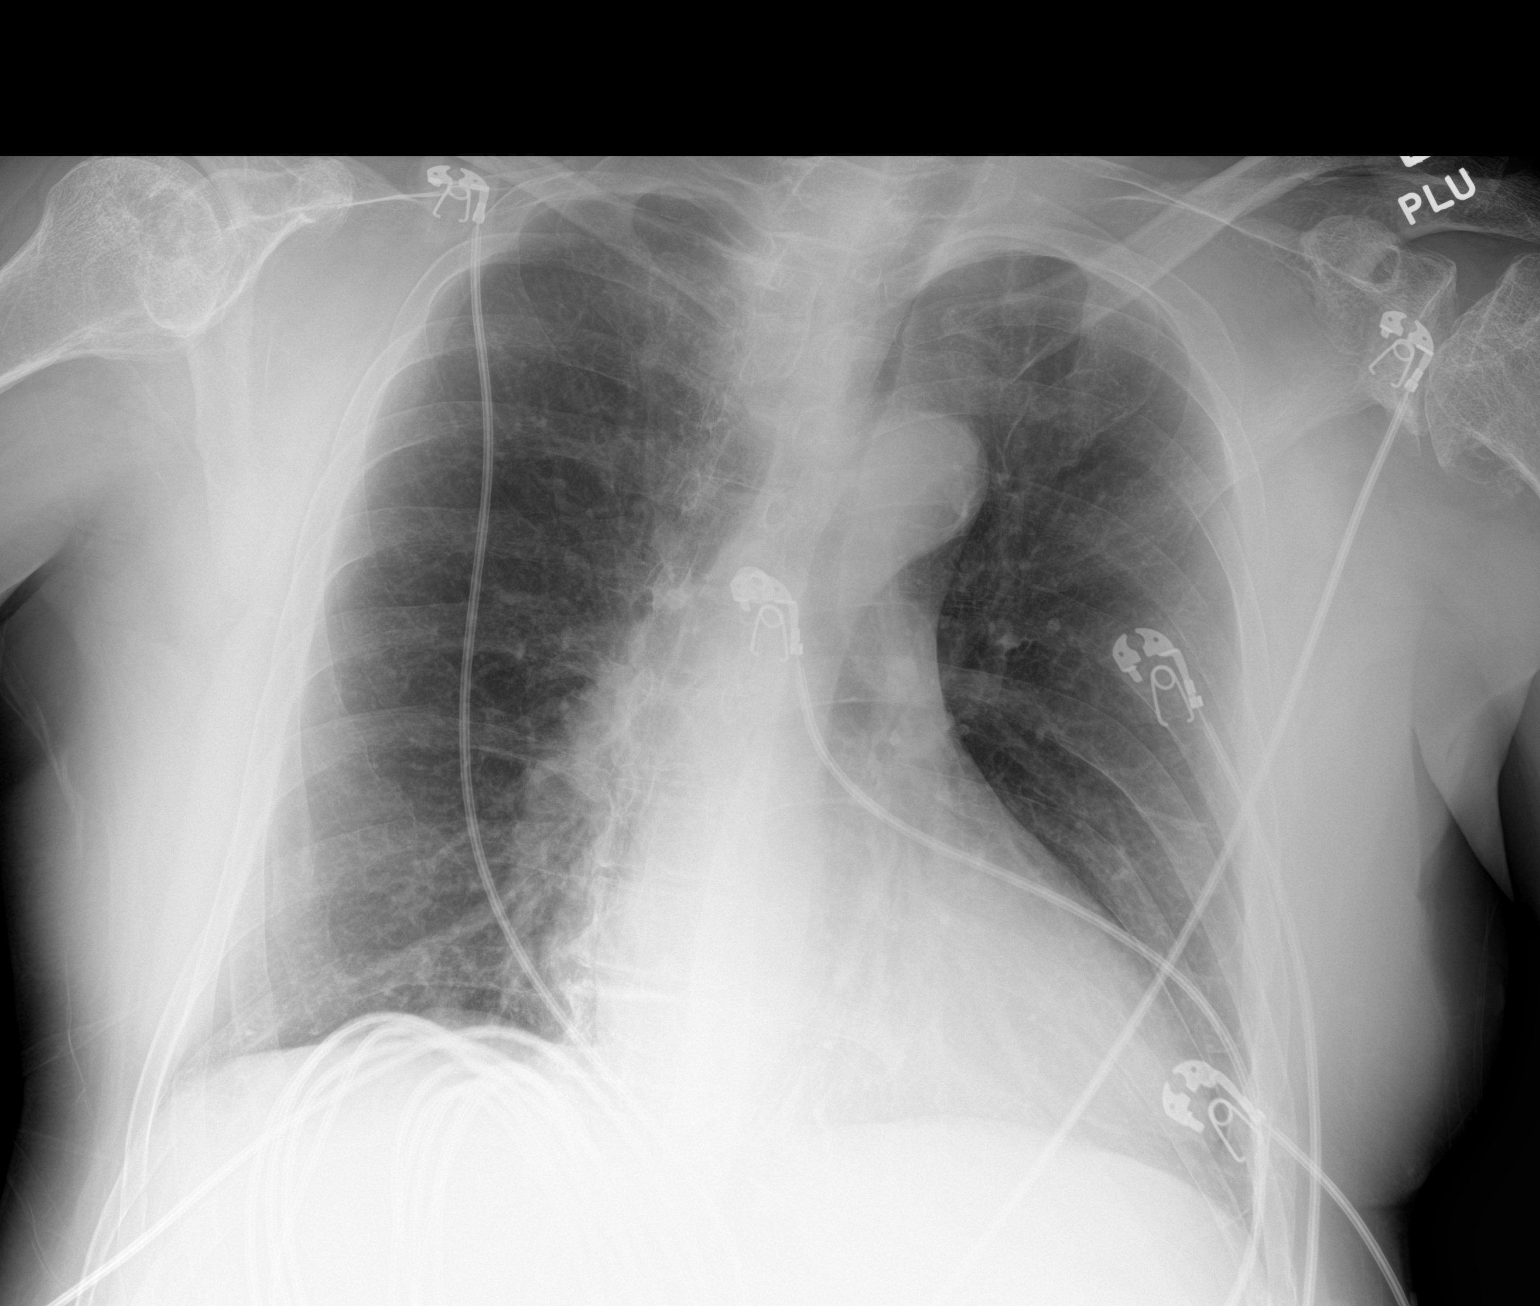

[1 of 1 positions shown; findings below may reference images not displayed]

FINDINGS: The heart size and mediastinal contours are within normal limits.
Both lungs are clear. The visualized skeletal structures are
unremarkable.
IMPRESSION: No active disease.

## 2023-03-24 ENCOUNTER — Other Ambulatory Visit: Payer: Self-pay

## 2023-06-08 ENCOUNTER — Telehealth: Payer: Self-pay | Admitting: Physician Assistant

## 2023-06-08 ENCOUNTER — Ambulatory Visit: Payer: Self-pay

## 2023-06-08 DIAGNOSIS — J208 Acute bronchitis due to other specified organisms: Secondary | ICD-10-CM

## 2023-06-08 DIAGNOSIS — J4551 Severe persistent asthma with (acute) exacerbation: Secondary | ICD-10-CM

## 2023-06-08 DIAGNOSIS — B9689 Other specified bacterial agents as the cause of diseases classified elsewhere: Secondary | ICD-10-CM

## 2023-06-08 MED ORDER — AZITHROMYCIN 250 MG PO TABS: ORAL_TABLET | ORAL | 0 refills | Status: DC

## 2023-06-08 MED ORDER — BUDESONIDE-FORMOTEROL FUMARATE 160-4.5 MCG/ACT IN AERO: 2.0000 | INHALATION_SPRAY | Freq: Two times a day (BID) | RESPIRATORY_TRACT | 0 refills | Status: DC

## 2023-06-08 MED ORDER — PROMETHAZINE-DM 6.25-15 MG/5ML PO SYRP: 5.0000 mL | ORAL_SOLUTION | Freq: Four times a day (QID) | ORAL | 0 refills | Status: DC | PRN

## 2023-06-08 NOTE — Patient Instructions (Signed)
Tony Calderon, thank you for joining [name], PA-C for today's virtual visit.  While this provider is not your primary care provider (PCP), if your PCP is located in our provider database this encounter information will be shared with them immediately following your visit.   A [facility] MyChart account gives you access to today's visit and all your visits, tests, and labs performed at [facility] " click here if you don't have a [facility] MyChart account or go to [email or web address]  Consent: (Patient) Tony Calderon provided verbal consent for this virtual visit at the beginning of the encounter.  Current Medications:  Current Outpatient Medications:    azithromycin (ZITHROMAX) 250 MG tablet, Take 2 tablets on day 1, then 1 tablet daily on days 2 through 5, Disp: 6 tablet, Rfl: 0   budesonide-formoterol (SYMBICORT) 160-4.5 MCG/ACT inhaler, Inhale 2 puffs into the lungs 2 (two) times daily., Disp: 1 each, Rfl: 0   promethazine-dextromethorphan (PROMETHAZINE-DM) 6.25-15 MG/5ML syrup, Take 5 mLs by mouth 4 (four) times daily as needed., Disp: 118 mL, Rfl: 0   ALPRAZolam (XANAX) 0.25 MG tablet, Take 1 tablet (0.25 mg total) by mouth at bedtime as needed for anxiety., Disp: 8 tablet, Rfl: 0   chlordiazePOXIDE (LIBRIUM) 25 MG capsule, 50mg  PO TID x 1D, then 25-50mg  PO BID X 1D, then 25-50mg  PO QD X 1D, Disp: 10 capsule, Rfl: 0   cyclobenzaprine (FLEXERIL) 5 MG tablet, Take 1-2 tablets (5-10 mg total) by mouth 2 (two) times daily as needed for muscle spasms., Disp: 24 tablet, Rfl: 0   diphenhydrAMINE (BENADRYL) 25 mg capsule, Take 25 mg by mouth every 6 (six) hours as needed. , Disp: , Rfl:    hydrOXYzine (ATARAX/VISTARIL) 25 MG tablet, Take 1 tablet (25 mg total) by mouth every 6 (six) hours as needed for anxiety., Disp: 24 tablet, Rfl: 0   lisinopril (ZESTRIL) 10 MG tablet, Take 1 tablet (10 mg total) by mouth daily., Disp: 30 tablet, Rfl: 1   loratadine  (CLARITIN) 10 MG tablet, Take 10 mg by mouth daily., Disp: , Rfl:    Multiple Vitamin (MULTIVITAMIN) tablet, Take 1 tablet by mouth daily., Disp: , Rfl:    omeprazole (PRILOSEC) 20 MG capsule, Take 1 capsule (20 mg total) by mouth daily., Disp: 30 capsule, Rfl: 3   prednisoLONE acetate (PRED FORTE) 1 % ophthalmic suspension, Place 1 drop into the right eye 4 (four) times daily., Disp: 5 mL, Rfl: 0   sucralfate (CARAFATE) 1 GM/10ML suspension, Take 10 mLs (1 g total) by mouth 4 (four) times daily., Disp: 420 mL, Rfl: 1   Medications ordered in this encounter:  Meds ordered this encounter  Medications   budesonide-formoterol (SYMBICORT) 160-4.5 MCG/ACT inhaler    Sig: Inhale 2 puffs into the lungs 2 (two) times daily.    Dispense:  1 each    Refill:  0    Order Specific Question:   Supervising Provider    Answer:   Loreli Dollar   azithromycin (ZITHROMAX) 250 MG tablet    Sig: Take 2 tablets on day 1, then 1 tablet daily on days 2 through 5    Dispense:  6 tablet    Refill:  0    Order Specific Question:   Supervising Provider    Answer:   Merrilee Jansky [[id number]]   promethazine-dextromethorphan (PROMETHAZINE-DM) 6.25-15 MG/5ML syrup    Sig: Take 5 mLs by mouth 4 (four) times daily as needed.  Dispense:  118 mL    Refill:  0    Order Specific Question:   Supervising Provider    Answer:   Merrilee Jansky [[id number]]     *If you need refills on other medications prior to your next appointment, please contact your pharmacy*  Follow-Up: Call back or seek an in-person evaluation if the symptoms worsen or if the condition fails to improve as anticipated.  [facility] [phone]  Other Instructions Acute Bronchitis, Adult  Acute bronchitis is sudden inflammation of the main airways (bronchi) that come off the windpipe (trachea) in the lungs. The swelling causes the airways to get smaller and make more mucus than normal. This can make it hard to  breathe and can cause coughing or noisy breathing (wheezing). Acute bronchitis may last several weeks. The cough may last longer. Allergies, asthma, and exposure to smoke may make the condition worse. What are the causes? This condition can be caused by germs and by substances that irritate the lungs, including: Cold and flu viruses. The most common cause of this condition is the virus that causes the common cold. Bacteria. This is less common. Breathing in substances that irritate the lungs, including: Smoke from cigarettes and other forms of tobacco. Dust and pollen. Fumes from household cleaning products, gases, or burned fuel. Indoor or outdoor air pollution. What increases the risk? The following factors may make you more likely to develop this condition: A weak body's defense system, also called the immune system. A condition that affects your lungs and breathing, such as asthma. What are the signs or symptoms? Common symptoms of this condition include: Coughing. This may bring up clear, yellow, or green mucus from your lungs (sputum). Wheezing. Runny or stuffy nose. Having too much mucus in your lungs (chest congestion). Shortness of breath. Aches and pains, including sore throat or chest. How is this diagnosed? This condition is usually diagnosed based on: Your symptoms and medical history. A physical exam. You may also have other tests, including tests to rule out other conditions, such as pneumonia. These tests include: A test of lung function. Test of a mucus sample to look for the presence of bacteria. Tests to check the oxygen level in your blood. Blood tests. Chest X-ray. How is this treated? Most cases of acute bronchitis clear up over time without treatment. Your health care provider may recommend: Drinking more fluids to help thin your mucus so it is easier to cough up. Taking inhaled medicine (inhaler) to improve air flow in and out of your lungs. Using a  vaporizer or a humidifier. These are machines that add water to the air to help you breathe better. Taking a medicine that thins mucus and clears congestion (expectorant). Taking a medicine that prevents or stops coughing (cough suppressant). It is not common to take an antibiotic medicine for this condition. Follow these instructions at home:  Take over-the-counter and prescription medicines only as told by your health care provider. Use an inhaler, vaporizer, or humidifier as told by your health care provider. Take two teaspoons (10 mL) of honey at bedtime to lessen coughing at night. Drink enough fluid to keep your urine pale yellow. Do not use any products that contain nicotine or tobacco. These products include cigarettes, chewing tobacco, and vaping devices, such as e-cigarettes. If you need help quitting, ask your health care provider. Get plenty of rest. Return to your normal activities as told by your health care provider. Ask your health care provider  what activities are safe for you. Keep all follow-up visits. This is important. How is this prevented? To lower your risk of getting this condition again: Wash your hands often with soap and water for at least 20 seconds. If soap and water are not available, use hand sanitizer. Avoid contact with people who have cold symptoms. Try not to touch your mouth, nose, or eyes with your hands. Avoid breathing in smoke or chemical fumes. Breathing smoke or chemical fumes will make your condition worse. Get the flu shot every year. Contact a health care provider if: Your symptoms do not improve after 2 weeks. You have trouble coughing up the mucus. Your cough keeps you awake at night. You have a fever. Get help right away if you: Cough up blood. Feel pain in your chest. Have severe shortness of breath. Faint or keep feeling like you are going to faint. Have a severe headache. Have a fever or chills that get worse. These symptoms may  represent a serious problem that is an emergency. Do not wait to see if the symptoms will go away. Get medical help right away. Call your local emergency services (911 in the [place] Do not drive yourself to the hospital. Summary Acute bronchitis is inflammation of the main airways (bronchi) that come off the windpipe (trachea) in the lungs. The swelling causes the airways to get smaller and make more mucus than normal. Drinking more fluids can help thin your mucus so it is easier to cough up. Take over-the-counter and prescription medicines only as told by your health care provider. Do not use any products that contain nicotine or tobacco. These products include cigarettes, chewing tobacco, and vaping devices, such as e-cigarettes. If you need help quitting, ask your health care provider. Contact a health care provider if your symptoms do not improve after 2 weeks. This information is not intended to replace advice given to you by your health care provider. Make sure you discuss any questions you have with your health care provider. Document Revised: 10/31/2021 Document Reviewed: 11/21/2020 Elsevier Patient Education  2024 [employer]    If you have been instructed to have an in-person evaluation today at a local Urgent Care facility, please use the link below. It will take you to a list of all of our available [facility], including address, phone number and hours of operation. Please do not delay care.  [facility]  If you or a family member do not have a primary care provider, use the link below to schedule a visit and establish care. When you choose a [facility] primary care physician or advanced practice provider, you gain a long-term partner in health. Find a Primary Care Provider  Learn more about [facility]'s in-office and virtual care options: [facility] - Get Care Now

## 2023-06-08 NOTE — Progress Notes (Signed)
Virtual Visit Consent   Tony Calderon, you are scheduled for a virtual visit with a [facility] provider today. Just as with appointments in the office, your consent must be obtained to participate. Your consent will be active for this visit and any virtual visit you may have with one of our providers in the next 365 days. If you have a MyChart account, a copy of this consent can be sent to you electronically.  As this is a virtual visit, video technology does not allow for your provider to perform a traditional examination. This may limit your provider's ability to fully assess your condition. If your provider identifies any concerns that need to be evaluated in person or the need to arrange testing (such as labs, EKG, etc.), we will make arrangements to do so. Although advances in technology are sophisticated, we cannot ensure that it will always work on either your end or our end. If the connection with a video visit is poor, the visit may have to be switched to a telephone visit. With either a video or telephone visit, we are not always able to ensure that we have a secure connection.  By engaging in this virtual visit, you consent to the provision of healthcare and authorize for your insurance to be billed (if applicable) for the services provided during this visit. Depending on your insurance coverage, you may receive a charge related to this service.  I need to obtain your verbal consent now. Are you willing to proceed with your visit today? Tony Calderon has provided verbal consent on 06/08/2023 for a virtual visit (video or telephone). [name], PA-C  Date: 06/08/2023 3:47 PM  Virtual Visit via Video Note   I, [name], connected with  Tony Calderon  ([id number], 1961/01/31) on 06/08/23 at  3:15 PM EST by a video-enabled telemedicine application and verified that I am speaking with the correct person using two identifiers.  Location: Patient: Virtual  Visit Location Patient: Home Provider: Virtual Visit Location Provider: Home Office   I discussed the limitations of evaluation and management by telemedicine and the availability of in person appointments. The patient expressed understanding and agreed to proceed.    Interactive audio and video communications were attempted, although failed due to patient's inability to connect to video. Continued visit with audio only interaction with patient agreement.   History of Present Illness: Tony Calderon is a 62 y.o. who identifies as a male who was assigned male at birth, and is being seen today for cough.  HPI: Cough This is a new problem. The current episode started in the past 7 days. The problem has been gradually worsening. The cough is Productive of sputum and productive of purulent sputum. Associated symptoms include chills, a fever (subjective fevers), headaches, myalgias, nasal congestion, postnasal drip, rhinorrhea and wheezing. Pertinent negatives include no ear congestion, ear pain, hemoptysis, sore throat, shortness of breath or sweats. The symptoms are aggravated by lying down. He has tried leukotriene antagonists and a beta-agonist inhaler (mucinex, tylenol) for the symptoms. The treatment provided no relief. His past medical history is significant for asthma, bronchitis and COPD.     Problems:  Patient Active Problem List   Diagnosis Date Noted   COPD exacerbation (HCC) 01/03/2017    Allergies: No Known Allergies Medications:  Current Outpatient Medications:    azithromycin (ZITHROMAX) 250 MG tablet, Take 2 tablets on day 1, then 1 tablet daily on days 2 through  5, Disp: 6 tablet, Rfl: 0   budesonide-formoterol (SYMBICORT) 160-4.5 MCG/ACT inhaler, Inhale 2 puffs into the lungs 2 (two) times daily., Disp: 1 each, Rfl: 0   promethazine-dextromethorphan (PROMETHAZINE-DM) 6.25-15 MG/5ML syrup, Take 5 mLs by mouth 4 (four) times daily as needed., Disp: 118 mL, Rfl: 0    ALPRAZolam (XANAX) 0.25 MG tablet, Take 1 tablet (0.25 mg total) by mouth at bedtime as needed for anxiety., Disp: 8 tablet, Rfl: 0   chlordiazePOXIDE (LIBRIUM) 25 MG capsule, 50mg  PO TID x 1D, then 25-50mg  PO BID X 1D, then 25-50mg  PO QD X 1D, Disp: 10 capsule, Rfl: 0   cyclobenzaprine (FLEXERIL) 5 MG tablet, Take 1-2 tablets (5-10 mg total) by mouth 2 (two) times daily as needed for muscle spasms., Disp: 24 tablet, Rfl: 0   diphenhydrAMINE (BENADRYL) 25 mg capsule, Take 25 mg by mouth every 6 (six) hours as needed. , Disp: , Rfl:    hydrOXYzine (ATARAX/VISTARIL) 25 MG tablet, Take 1 tablet (25 mg total) by mouth every 6 (six) hours as needed for anxiety., Disp: 24 tablet, Rfl: 0   lisinopril (ZESTRIL) 10 MG tablet, Take 1 tablet (10 mg total) by mouth daily., Disp: 30 tablet, Rfl: 1   loratadine (CLARITIN) 10 MG tablet, Take 10 mg by mouth daily., Disp: , Rfl:    Multiple Vitamin (MULTIVITAMIN) tablet, Take 1 tablet by mouth daily., Disp: , Rfl:    omeprazole (PRILOSEC) 20 MG capsule, Take 1 capsule (20 mg total) by mouth daily., Disp: 30 capsule, Rfl: 3   prednisoLONE acetate (PRED FORTE) 1 % ophthalmic suspension, Place 1 drop into the right eye 4 (four) times daily., Disp: 5 mL, Rfl: 0   sucralfate (CARAFATE) 1 GM/10ML suspension, Take 10 mLs (1 g total) by mouth 4 (four) times daily., Disp: 420 mL, Rfl: 1  Observations/Objective: Patient is well-developed, well-nourished in no acute distress.  Resting comfortably at home.  Head is normocephalic, atraumatic.  No labored breathing.  Speech is clear and coherent with logical content.  Patient is alert and oriented at baseline.    Assessment and Plan: 1. Acute bacterial bronchitis - azithromycin (ZITHROMAX) 250 MG tablet; Take 2 tablets on day 1, then 1 tablet daily on days 2 through 5  Dispense: 6 tablet; Refill: 0 - promethazine-dextromethorphan (PROMETHAZINE-DM) 6.25-15 MG/5ML syrup; Take 5 mLs by mouth 4 (four) times daily as needed.   Dispense: 118 mL; Refill: 0  2. Severe persistent asthma with exacerbation - budesonide-formoterol (SYMBICORT) 160-4.5 MCG/ACT inhaler; Inhale 2 puffs into the lungs 2 (two) times daily.  Dispense: 1 each; Refill: 0  - Worsening over a week despite OTC medications - Will treat with Z-pack,Prednisone, and Promethazine DM cough syrup - Symbicort refilled - Can continue Mucinex  - Push fluids.  - Rest.  - Steam and humidifier can help - Seek in person evaluation if worsening or symptoms fail to improve    Follow Up Instructions: I discussed the assessment and treatment plan with the patient. The patient was provided an opportunity to ask questions and all were answered. The patient agreed with the plan and demonstrated an understanding of the instructions.  A copy of instructions were sent to the patient via MyChart unless otherwise noted below.    The patient was advised to call back or seek an in-person evaluation if the symptoms worsen or if the condition fails to improve as anticipated.   I have spent 12 minutes in review and updating patient chart, medical decision making and response to  patient.   [name], PA-C   [name], [place]

## 2023-06-08 NOTE — Telephone Encounter (Signed)
  Chief Complaint: URI - cough Asthma Symptoms: above Frequency: today Pertinent Negatives: Patient denies has chills - no fever Disposition: [] ED /[] Urgent Care (no appt availability in office) / [] Appointment(In office/virtual)/ [x]  [facility]/ [] Home Care/ [] Refused Recommended Disposition /[] [facility]/ []  Follow-up with PCP Additional Notes: Pt called with URI s/s. Pt also has asthma which is adding to his cough and breathing difficulties. Pt does not have a pcp. Made MyChart video visit for this afternoon.    Summary: flu like symptoms   Pt called in states has cough, achy and woke up with chills this morning. He isnt sure what his temp is     Reason for Disposition  [1] MILD difficulty breathing (e.g., minimal/no SOB at rest, SOB with walking, pulse <100) AND [2] still present when not coughing  Answer Assessment - Initial Assessment Questions 1. ONSET: "When did the cough begin?"      Yesterday 2. SEVERITY: "How bad is the cough today?"      Moderate 3. SPUTUM: "Describe the color of your sputum" (none, dry cough; clear, white, yellow, green)     Clear 4. HEMOPTYSIS: "Are you coughing up any blood?" If so ask: "How much?" (flecks, streaks, tablespoons, etc.)     no 5. DIFFICULTY BREATHING: "Are you having difficulty breathing?" If Yes, ask: "How bad is it?" (e.g., mild, moderate, severe)    - MILD: No SOB at rest, mild SOB with walking, speaks normally in sentences, can lie down, no retractions, pulse < 100.    - MODERATE: SOB at rest, SOB with minimal exertion and prefers to sit, cannot lie down flat, speaks in phrases, mild retractions, audible wheezing, pulse 100-120.    - SEVERE: Very SOB at rest, speaks in single words, struggling to breathe, sitting hunched forward, retractions, pulse > 120      moderate 6. FEVER: "Do you have a fever?" If Yes, ask: "What is your temperature, how was it measured, and when did it start?"     Chills 7. CARDIAC  HISTORY: "Do you have any history of heart disease?" (e.g., heart attack, congestive heart failure)      HTN 8. LUNG HISTORY: "Do you have any history of lung disease?"  (e.g., pulmonary embolus, asthma, emphysema)     Asthma 10. OTHER SYMPTOMS: "Do you have any other symptoms?" (e.g., runny nose, wheezing, chest pain)       Runny nose, Wheezing  Protocols used: Cough - Acute Productive-A-AH

## 2023-06-09 ENCOUNTER — Ambulatory Visit: Payer: Self-pay | Admitting: *Deleted

## 2023-06-09 MED ORDER — AZITHROMYCIN 250 MG PO TABS: ORAL_TABLET | ORAL | 0 refills | Status: AC

## 2023-06-09 NOTE — Telephone Encounter (Signed)
  Chief Complaint: Pt did a virtual visit yesterday with Joycelyn Man.   He has lost the Z Pak she prescribed for him.   Asking to see if it can be replaced.    I sent a Secure Chat to [facility].   She responded that she will resend it for him to the [facility] in [place] as pt requested.   I let pt know.     Symptoms: N/A Frequency: N/A Pertinent Negatives: Patient denies N/A Disposition: [] ED /[] Urgent Care (no appt availability in office) / [] Appointment(In office/virtual)/ []  [facility]/ [x] Home Care/ [] Refused Recommended Disposition /[] [facility]/ []  Follow-up with PCP Additional Notes: Issue resolved.   See above

## 2023-06-09 NOTE — Addendum Note (Signed)
Addended by: [name] on: 06/09/2023 01:30 PM   Modules accepted: Orders

## 2023-06-09 NOTE — Telephone Encounter (Signed)
Reason for Disposition  [1] Caller has URGENT medicine question about med that PCP or specialist prescribed AND [2] triager unable to answer question  Answer Assessment - Initial Assessment Questions 1. NAME of MEDICINE: "What medicine(s) are you calling about?"      Lost the Z Pak.    I did a virtual visit with Joycelyn Man yesterday.   I can't find it.   2. QUESTION: "What is your question?" (e.g., double dose of medicine, side effect)     I cannot find the Z Pak anywhere.    3. PRESCRIBER: "Who prescribed the medicine?" Reason: if prescribed by specialist, call should be referred to that group.     Joycelyn Man 4. SYMPTOMS: "Do you have any symptoms?" If Yes, ask: "What symptoms are you having?"  "How bad are the symptoms (e.g., mild, moderate, severe)     N/A 5. PREGNANCY:  "Is there any chance that you are pregnant?" "When was your last menstrual period?"     N/A  Protocols used: Medication Question Call-A-AH

## 2023-06-23 ENCOUNTER — Emergency Department (HOSPITAL_COMMUNITY)
Admission: EM | Admit: 2023-06-23 | Discharge: 2023-06-23 | Disposition: A | Discharge status: Home / Self Care | Payer: Self-pay | Attending: Emergency Medicine | Admitting: Emergency Medicine

## 2023-06-23 ENCOUNTER — Encounter (HOSPITAL_COMMUNITY): Payer: Self-pay

## 2023-06-23 ENCOUNTER — Emergency Department (HOSPITAL_COMMUNITY): Payer: Self-pay

## 2023-06-23 ENCOUNTER — Other Ambulatory Visit: Payer: Self-pay

## 2023-06-23 DIAGNOSIS — W1839XA Other fall on same level, initial encounter: Secondary | ICD-10-CM | POA: Insufficient documentation

## 2023-06-23 DIAGNOSIS — Z79899 Other long term (current) drug therapy: Secondary | ICD-10-CM | POA: Insufficient documentation

## 2023-06-23 DIAGNOSIS — R569 Unspecified convulsions: Secondary | ICD-10-CM | POA: Insufficient documentation

## 2023-06-23 DIAGNOSIS — S0083XA Contusion of other part of head, initial encounter: Secondary | ICD-10-CM | POA: Insufficient documentation

## 2023-06-23 DIAGNOSIS — J45909 Unspecified asthma, uncomplicated: Secondary | ICD-10-CM | POA: Insufficient documentation

## 2023-06-23 DIAGNOSIS — I10 Essential (primary) hypertension: Secondary | ICD-10-CM | POA: Insufficient documentation

## 2023-06-23 LAB — CBC WITH DIFFERENTIAL/PLATELET
Abs Immature Granulocytes: 0.05 10*3/uL (ref 0.00–0.07)
Basophils Absolute: 0.1 10*3/uL (ref 0.0–0.1)
Basophils Relative: 1 %
Eosinophils Absolute: 0.2 10*3/uL (ref 0.0–0.5)
Eosinophils Relative: 2 %
HCT: 39 % (ref 39.0–52.0)
Hemoglobin: 12.7 g/dL — ABNORMAL LOW (ref 13.0–17.0)
Immature Granulocytes: 1 %
Lymphocytes Relative: 15 %
Lymphs Abs: 1.5 10*3/uL (ref 0.7–4.0)
MCH: 28.7 pg (ref 26.0–34.0)
MCHC: 32.6 g/dL (ref 30.0–36.0)
MCV: 88 fL (ref 80.0–100.0)
Monocytes Absolute: 0.8 10*3/uL (ref 0.1–1.0)
Monocytes Relative: 8 %
Neutro Abs: 7.2 10*3/uL (ref 1.7–7.7)
Neutrophils Relative %: 73 %
Platelets: 222 10*3/uL (ref 150–400)
RBC: 4.43 MIL/uL (ref 4.22–5.81)
RDW: 17.6 % — ABNORMAL HIGH (ref 11.5–15.5)
WBC: 9.9 10*3/uL (ref 4.0–10.5)
nRBC: 0 % (ref 0.0–0.2)

## 2023-06-23 LAB — URINALYSIS, W/ REFLEX TO CULTURE (INFECTION SUSPECTED)
Bacteria, UA: NONE SEEN
Bilirubin Urine: NEGATIVE
Glucose, UA: NEGATIVE mg/dL
Ketones, ur: NEGATIVE mg/dL
Leukocytes,Ua: NEGATIVE
Nitrite: NEGATIVE
Protein, ur: NEGATIVE mg/dL
Specific Gravity, Urine: 1.02 (ref 1.005–1.030)
pH: 5 (ref 5.0–8.0)

## 2023-06-23 LAB — RAPID URINE DRUG SCREEN, HOSP PERFORMED
Amphetamines: NOT DETECTED
Barbiturates: POSITIVE — AB
Benzodiazepines: NOT DETECTED
Cocaine: POSITIVE — AB
Opiates: NOT DETECTED
Tetrahydrocannabinol: POSITIVE — AB

## 2023-06-23 LAB — COMPREHENSIVE METABOLIC PANEL
ALT: 31 U/L (ref 0–44)
AST: 74 U/L — ABNORMAL HIGH (ref 15–41)
Albumin: 3.3 g/dL — ABNORMAL LOW (ref 3.5–5.0)
Alkaline Phosphatase: 50 U/L (ref 38–126)
Anion gap: 9 (ref 5–15)
BUN: 7 mg/dL (ref 6–20)
CO2: 24 mmol/L (ref 22–32)
Calcium: 8.5 mg/dL — ABNORMAL LOW (ref 8.9–10.3)
Chloride: 104 mmol/L (ref 98–111)
Creatinine, Ser: 0.86 mg/dL (ref 0.61–1.24)
GFR, Estimated: >60 mL/min (ref >60)
Glucose, Bld: 100 mg/dL — ABNORMAL HIGH (ref 70–99)
Potassium: 3.7 mmol/L (ref 3.5–5.1)
Sodium: 137 mmol/L (ref 135–145)
Total Bilirubin: 0.8 mg/dL (ref <1.2)
Total Protein: 6.4 g/dL — ABNORMAL LOW (ref 6.5–8.1)

## 2023-06-23 LAB — ETHANOL: Alcohol, Ethyl (B): <10 mg/dL (ref <10)

## 2023-06-23 MED ORDER — PHENOBARBITAL SODIUM 130 MG/ML IJ SOLN
130.0000 mg | Freq: Once | INTRAMUSCULAR | Status: AC
  Administered 2023-06-23: 130 mg via INTRAVENOUS
  Filled 2023-06-23: qty 1

## 2023-06-23 MED ORDER — IBUPROFEN 400 MG PO TABS
600.0000 mg | ORAL_TABLET | Freq: Once | ORAL | Status: AC
  Administered 2023-06-23: 600 mg via ORAL
  Filled 2023-06-23: qty 1

## 2023-06-23 MED ORDER — CHLORDIAZEPOXIDE HCL 25 MG PO CAPS: ORAL_CAPSULE | ORAL | 0 refills | Status: DC

## 2023-06-23 NOTE — ED Provider Notes (Signed)
[place] EMERGENCY DEPARTMENT AT [facility] Provider Note  CSN: [id number] Arrival date & time: 06/23/23 1740  Chief Complaint(s) Seizures  HPI Tony Calderon is a 62 y.o. male with history of alcohol use presenting to the emergency department with seizure like activity.  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  This lasted around 3 minutes.  Patient then was very confused per his coworker.  Slowly returned back to normal by the time the ambulance was there.  Patient reports he currently feels well.  Denies any recent chest pain, shortness of breath, headaches, fevers or chills, neck pain.  Does have a bruise on his forehead from the accident has a mild headache.  No neck pain after the accident.  No other pain in his arms or legs.  Does drink alcohol daily, around 6-8 drinks, usually in the afternoon or evening.  No alcohol use today.  Was not feeling shaky or jittery, anxious.   Past Medical History Past Medical History:  Diagnosis Date   Allergy    Asthma    Hypertension    Seizures (HCC)    Patient Active Problem List   Diagnosis Date Noted   COPD exacerbation (HCC) 01/03/2017   Home Medication(s) Prior to Admission medications   Medication Sig Start Date End Date Taking? Authorizing Provider  chlordiazePOXIDE (LIBRIUM) 25 MG capsule 50mg  PO TID x 1D, then 25-50mg  PO BID X 1D, then 25-50mg  PO QD X 1D 06/23/23  Yes [name], MD  budesonide-formoterol (SYMBICORT) 160-4.5 MCG/ACT inhaler Inhale 2 puffs into the lungs 2 (two) times daily. 06/08/23   [name], PA-C  cyclobenzaprine (FLEXERIL) 5 MG tablet Take 1-2 tablets (5-10 mg total) by mouth 2 (two) times daily as needed for muscle spasms. 12/28/18   [name], [name], PA-C  diphenhydrAMINE (BENADRYL) 25 mg capsule Take  25 mg by mouth every 6 (six) hours as needed.     [provider]  hydrOXYzine (ATARAX/VISTARIL) 25 MG tablet Take 1 tablet (25 mg total) by mouth every 6 (six) hours as needed for anxiety. 08/09/19   [name], [name], PA-C  lisinopril (ZESTRIL) 10 MG tablet Take 1 tablet (10 mg total) by mouth daily. 10/25/19 12/24/19  [name], MD  loratadine (CLARITIN) 10 MG tablet Take 10 mg by mouth daily.    [provider]  Multiple Vitamin (MULTIVITAMIN) tablet Take 1 tablet by mouth daily.    [provider]  omeprazole (PRILOSEC) 20 MG capsule Take 1 capsule (20 mg total) by mouth daily. 03/08/18 04/07/18  [name], MD  prednisoLONE acetate (PRED FORTE) 1 % ophthalmic suspension Place 1 drop into the right eye 4 (four) times daily. 12/13/18   [name], MD  promethazine-dextromethorphan (PROMETHAZINE-DM) 6.25-15 MG/5ML syrup Take 5 mLs by mouth 4 (four) times daily as needed. 06/08/23   [name], PA-C  sucralfate (CARAFATE) 1 GM/10ML suspension Take 10 mLs (1 g total) by mouth 4 (four) times daily. 10/25/19 10/24/20  [name], MD  Past Surgical History Past Surgical History:  Procedure Laterality Date   ANKLE ARTHROCENTESIS Right    Family History Family History  Problem Relation Age of Onset   Cancer Mother    Hypertension Father    Heart disease Father     Social History Social History   Tobacco Use   Smoking status: Never   Smokeless tobacco: Never  Vaping Use   Vaping status: Never Used  Substance Use Topics   Alcohol use: Yes   Drug use: No   Allergies Patient has no known allergies.  Review of Systems Review of Systems  All other systems reviewed and are negative.   Physical Exam Vital Signs  I have reviewed the triage vital signs BP (!) 172/101   Pulse 96   Temp 98.7 F (37.1 C)  (Oral)   Resp 19   SpO2 100%  Physical Exam Vitals and nursing note reviewed.  Constitutional:      General: He is not in acute distress.    Appearance: Normal appearance.  HENT:     Head: Normocephalic.     Comments: Small hematoma to right anterior forehead with abrasion.  No laceration.    Mouth/Throat:     Mouth: Mucous membranes are moist.  Eyes:     Conjunctiva/sclera: Conjunctivae normal.  Cardiovascular:     Rate and Rhythm: Normal rate and regular rhythm.  Pulmonary:     Effort: Pulmonary effort is normal. No respiratory distress.     Breath sounds: Normal breath sounds.  Abdominal:     General: Abdomen is flat.     Palpations: Abdomen is soft.     Tenderness: There is no abdominal tenderness.  Musculoskeletal:     Right lower leg: No edema.     Left lower leg: No edema.     Comments: No midline C, T, L-spine tenderness.  No chest wall tenderness or crepitus.  Full painless range of motion at the bilateral upper extremities including the shoulders, elbows, wrists, hand and fingers, and in the bilateral lower extremities including the hips, knees, ankle, toes.  No focal bony tenderness, injury or deformity.   Skin:    General: Skin is warm and dry.     Capillary Refill: Capillary refill takes less than 2 seconds.  Neurological:     Mental Status: He is alert and oriented to person, place, and time. Mental status is at baseline.     Comments: Cranial nerves II through XII intact, strength 5 out of 5 in the bilateral upper and lower extremities, no sensory deficit to light touch, no dysmetria on finger-nose-finger testing, ambulatory with steady gait.  Psychiatric:        Mood and Affect: Mood normal.        Behavior: Behavior normal.     ED Results and Treatments Labs (all labs ordered are listed, but only abnormal results are displayed) Labs Reviewed  COMPREHENSIVE METABOLIC PANEL - Abnormal; Notable for the following components:      Result Value   Glucose, Bld  100 (*)    Calcium 8.5 (*)    Total Protein 6.4 (*)    Albumin 3.3 (*)    AST 74 (*)    All other components within normal limits  CBC WITH DIFFERENTIAL/PLATELET - Abnormal; Notable for the following components:   Hemoglobin 12.7 (*)    RDW 17.6 (*)    All other components within normal limits  RAPID URINE DRUG SCREEN, HOSP PERFORMED - Abnormal; Notable for the following  components:   Cocaine POSITIVE (*)    Tetrahydrocannabinol POSITIVE (*)    Barbiturates POSITIVE (*)    All other components within normal limits  URINALYSIS, W/ REFLEX TO CULTURE (INFECTION SUSPECTED) - Abnormal; Notable for the following components:   Hgb urine dipstick SMALL (*)    All other components within normal limits  ETHANOL                                                                                                                          Radiology CT Head Wo Contrast  Result Date: 06/23/2023 CLINICAL DATA:  Seizure, no history of trauma EXAM: CT HEAD WITHOUT CONTRAST TECHNIQUE: Contiguous axial images were obtained from the base of the skull through the vertex without intravenous contrast. RADIATION DOSE REDUCTION: This exam was performed according to the departmental dose-optimization program which includes automated exposure control, adjustment of the mA and/or kV according to patient size and/or use of iterative reconstruction technique. COMPARISON:  10/16/2020 FINDINGS: Brain: No evidence of acute infarction, hemorrhage, mass, mass effect, or midline shift. No hydrocephalus or extra-axial fluid collection. Vascular: No hyperdense vessel. Skull: Negative for fracture or focal lesion. Right frontal scalp and forehead hematoma. Sinuses/Orbits: Mucosal thickening in the ethmoid air cells. No acute finding in the orbits. Other: Trace fluid in the right mastoid air cells. IMPRESSION: No acute intracranial process. Electronically Signed   By: Wiliam Ke M.D.   On: 06/23/2023 20:39    Pertinent labs &  imaging results that were available during my care of the patient were reviewed by me and considered in my medical decision making (see MDM for details).  Medications Ordered in ED Medications  PHENObarbital (LUMINAL) injection 130 mg (130 mg Intravenous Given 06/23/23 1832)                                                                                                                                     Procedures Procedures  (including critical care time)  Medical Decision Making / ED Course   MDM:  62 year old presenting with possible seizure.  Per history provided by patient, paramedics and his coworkers certainly does sound consistent with seizure.  Seems more likely to be seizure than a syncopal event.  He does report a prior episode in the past, reports seeing a neurologist but was never started on anything, was a few years ago.  Could be alcohol related,  but patient currently without any symptoms of alcohol withdrawal.  Will give dose of phenobarbital just in case this is related.  Will check other laboratory testing to evaluate for underlying toxic or metabolic cause.  No recent fevers, chills, headaches, meningismus to suggest meningitis or CNS infection.  Doubt space-occupying lesion or traumatic head injury but will obtain CT head to evaluate.  Will reassess.  Given second episode likely discussed with neurology whether patient needs to be on any antiepileptics.  Discussed seizure precautions with the patient.  Clinical Course as of 06/23/23 2119  Tue Jun 23, 2023  2116 Patient still feels well.  Laboratory workup with no obvious toxic or metabolic trigger, urine drug screen does show cocaine and patient does endorse cocaine use.  Discussed with Dr. Amada Jupiter with neurology, he recommends substance cessation especially cocaine, outpatient follow-up with neurology.  Patient does not need to be started on AED.  Discussed seizure precautions including no driving and listed on the  patient's AVS. Will discharge patient to home. All questions answered. Patient comfortable with plan of discharge. Return precautions discussed with patient and specified on the after visit summary.  [WS]    Clinical Course User Index [WS] [name], MD     Additional history obtained: -Additional history obtained from ems -External records from outside source obtained and reviewed including: Chart review including previous notes, labs, imaging, consultation notes including prior notes for seizure    Lab Tests: -I ordered, reviewed, and interpreted labs.   The pertinent results include:   Labs Reviewed  COMPREHENSIVE METABOLIC PANEL - Abnormal; Notable for the following components:      Result Value   Glucose, Bld 100 (*)    Calcium 8.5 (*)    Total Protein 6.4 (*)    Albumin 3.3 (*)    AST 74 (*)    All other components within normal limits  CBC WITH DIFFERENTIAL/PLATELET - Abnormal; Notable for the following components:   Hemoglobin 12.7 (*)    RDW 17.6 (*)    All other components within normal limits  RAPID URINE DRUG SCREEN, HOSP PERFORMED - Abnormal; Notable for the following components:   Cocaine POSITIVE (*)    Tetrahydrocannabinol POSITIVE (*)    Barbiturates POSITIVE (*)    All other components within normal limits  URINALYSIS, W/ REFLEX TO CULTURE (INFECTION SUSPECTED) - Abnormal; Notable for the following components:   Hgb urine dipstick SMALL (*)    All other components within normal limits  ETHANOL    Notable for +UDS   EKG   EKG Interpretation Date/Time:  Tuesday June 23 2023 18:23:20 EST Ventricular Rate:  93 PR Interval:  193 QRS Duration:  152 QT Interval:  398 QTC Calculation: 496 R Axis:   -39  Text Interpretation: Sinus rhythm Left bundle branch block Confirmed by Alvino Blood ([id number]) on 06/23/2023 7:14:43 PM         Imaging Studies ordered: I ordered imaging studies including CT head On my interpretation imaging  demonstrates no acute process I independently visualized and interpreted imaging. I agree with the radiologist interpretation   Medicines ordered and prescription drug management: Meds ordered this encounter  Medications   PHENObarbital (LUMINAL) injection 130 mg   chlordiazePOXIDE (LIBRIUM) 25 MG capsule    Sig: 50mg  PO TID x 1D, then 25-50mg  PO BID X 1D, then 25-50mg  PO QD X 1D    Dispense:  10 capsule    Refill:  0    -I have reviewed the patients  home medicines and have made adjustments as needed   Consultations Obtained: I requested consultation with the neurologist,  and discussed lab and imaging findings as well as pertinent plan - they recommend: substance cessation   Cardiac Monitoring: The patient was maintained on a cardiac monitor.  I personally viewed and interpreted the cardiac monitored which showed an underlying rhythm of: NSR  Social Determinants of Health:  Diagnosis or treatment significantly limited by social determinants of health: polysubstance abuse   Reevaluation: After the interventions noted above, I reevaluated the patient and found that their symptoms have improved  Co morbidities that complicate the patient evaluation  Past Medical History:  Diagnosis Date   Allergy    Asthma    Hypertension    Seizures (HCC)       Dispostion: Disposition decision including need for hospitalization was considered, and patient discharged from emergency department.    Final Clinical Impression(s) / ED Diagnoses Final diagnoses:  Seizure [facility])     This chart was dictated using voice recognition software.  Despite best efforts to proofread,  errors can occur which can change the documentation meaning.    [name], MD 06/23/23 2119

## 2023-06-23 NOTE — ED Triage Notes (Signed)
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 1 minute of tonic clonic seizure witnessed. Abrasion above right eye. Hx of seizure once previously.  140/90 BP 110s HR

## 2023-06-23 NOTE — Discharge Instructions (Addendum)
We evaluated you for your seizure.  Your testing was reassuring.  We did not see any sign of any head injury or skull fracture on your CT scan.  Your seizure is most likely related to either alcohol use or cocaine use.  Please stop using cocaine.  I would also advise stop alcohol use.  I have prescribed you a medication called Librium which you can take to help with alcohol withdrawal symptoms.  Do not drink alcohol while taking this medication.  We discussed your seizure with our neurologist who does not think you need to be started on an antiepileptic medication at this time.  Per [facility] statutes, patients with seizures are not allowed to drive until  they have been seizure-free for six months. Use caution when using heavy equipment or power tools. Avoid working on ladders or at heights. Take showers instead of baths. Ensure the water temperature is not too high on the home water heater. Do not go swimming alone. When caring for infants or small children, sit down when holding, feeding, or changing them to minimize risk of injury to the child in the event you have a seizure.   Please follow-up with your primary doctor and neurology.  We have placed a neurology referral.  Please return if you have any further seizures, nausea or vomiting, headaches, fevers or chills, chest pain, shortness of breath, or any other new symptoms.

## 2023-12-06 ENCOUNTER — Other Ambulatory Visit: Payer: Self-pay

## 2023-12-06 ENCOUNTER — Emergency Department (HOSPITAL_COMMUNITY): Payer: Self-pay

## 2023-12-06 ENCOUNTER — Emergency Department (HOSPITAL_COMMUNITY)
Admission: EM | Admit: 2023-12-06 | Discharge: 2023-12-06 | Disposition: A | Discharge status: Home / Self Care | Payer: Self-pay | Attending: Emergency Medicine | Admitting: Emergency Medicine

## 2023-12-06 DIAGNOSIS — G40909 Epilepsy, unspecified, not intractable, without status epilepticus: Secondary | ICD-10-CM | POA: Insufficient documentation

## 2023-12-06 DIAGNOSIS — R0602 Shortness of breath: Secondary | ICD-10-CM | POA: Insufficient documentation

## 2023-12-06 DIAGNOSIS — F10930 Alcohol use, unspecified with withdrawal, uncomplicated: Secondary | ICD-10-CM

## 2023-12-06 DIAGNOSIS — Z79899 Other long term (current) drug therapy: Secondary | ICD-10-CM | POA: Insufficient documentation

## 2023-12-06 DIAGNOSIS — R791 Abnormal coagulation profile: Secondary | ICD-10-CM | POA: Insufficient documentation

## 2023-12-06 DIAGNOSIS — R000 Tachycardia, unspecified: Secondary | ICD-10-CM | POA: Insufficient documentation

## 2023-12-06 DIAGNOSIS — F10129 Alcohol abuse with intoxication, unspecified: Secondary | ICD-10-CM | POA: Insufficient documentation

## 2023-12-06 LAB — COMPREHENSIVE METABOLIC PANEL WITH GFR
ALT: 21 U/L (ref 0–44)
AST: 53 U/L — ABNORMAL HIGH (ref 15–41)
Albumin: 3.4 g/dL — ABNORMAL LOW (ref 3.5–5.0)
Alkaline Phosphatase: 53 U/L (ref 38–126)
Anion gap: 15 (ref 5–15)
BUN: 7 mg/dL (ref 6–20)
CO2: 22 mmol/L (ref 22–32)
Calcium: 8.6 mg/dL — ABNORMAL LOW (ref 8.9–10.3)
Chloride: 99 mmol/L (ref 98–111)
Creatinine, Ser: 0.7 mg/dL (ref 0.61–1.24)
GFR, Estimated: >60 mL/min (ref >60)
Glucose, Bld: 90 mg/dL (ref 70–99)
Potassium: 3.5 mmol/L (ref 3.5–5.1)
Sodium: 136 mmol/L (ref 135–145)
Total Bilirubin: 1.6 mg/dL — ABNORMAL HIGH (ref 0.0–1.2)
Total Protein: 7.3 g/dL (ref 6.5–8.1)

## 2023-12-06 LAB — TROPONIN I (HIGH SENSITIVITY)
Troponin I (High Sensitivity): 19 ng/L — ABNORMAL HIGH (ref <18)
Troponin I (High Sensitivity): 17 ng/L (ref <18)

## 2023-12-06 LAB — CBC
HCT: 42.1 % (ref 39.0–52.0)
Hemoglobin: 13.5 g/dL (ref 13.0–17.0)
MCH: 27.8 pg (ref 26.0–34.0)
MCHC: 32.1 g/dL (ref 30.0–36.0)
MCV: 86.6 fL (ref 80.0–100.0)
Platelets: 210 10*3/uL (ref 150–400)
RBC: 4.86 MIL/uL (ref 4.22–5.81)
RDW: 16.6 % — ABNORMAL HIGH (ref 11.5–15.5)
WBC: 7.1 10*3/uL (ref 4.0–10.5)
nRBC: 0 % (ref 0.0–0.2)

## 2023-12-06 LAB — D-DIMER, QUANTITATIVE: D-Dimer, Quant: 1.51 ug{FEU}/mL — ABNORMAL HIGH (ref 0.00–0.50)

## 2023-12-06 LAB — MAGNESIUM: Magnesium: 1.6 mg/dL — ABNORMAL LOW (ref 1.7–2.4)

## 2023-12-06 LAB — BRAIN NATRIURETIC PEPTIDE: B Natriuretic Peptide: 181.4 pg/mL — ABNORMAL HIGH (ref 0.0–100.0)

## 2023-12-06 MED ORDER — LORAZEPAM 2 MG/ML IJ SOLN
1.0000 mg | Freq: Once | INTRAMUSCULAR | Status: AC
Start: 2023-12-06 — End: 2023-12-06
  Administered 2023-12-06: 1 mg via INTRAVENOUS
  Filled 2023-12-06: qty 1

## 2023-12-06 MED ORDER — MAGNESIUM SULFATE IN D5W 1-5 GM/100ML-% IV SOLN
1.0000 g | Freq: Once | INTRAVENOUS | Status: AC
Start: 2023-12-06 — End: 2023-12-06
  Administered 2023-12-06: 1 g via INTRAVENOUS
  Filled 2023-12-06: qty 100

## 2023-12-06 MED ORDER — CHLORDIAZEPOXIDE HCL 25 MG PO CAPS: ORAL_CAPSULE | ORAL | 0 refills | Status: DC

## 2023-12-06 MED ORDER — IPRATROPIUM-ALBUTEROL 0.5-2.5 (3) MG/3ML IN SOLN
3.0000 mL | Freq: Once | RESPIRATORY_TRACT | Status: AC
  Administered 2023-12-06: 3 mL via RESPIRATORY_TRACT
  Filled 2023-12-06: qty 3

## 2023-12-06 MED ORDER — ASPIRIN 81 MG PO CHEW
324.0000 mg | CHEWABLE_TABLET | Freq: Once | ORAL | Status: AC
Start: 2023-12-06 — End: 2023-12-06
  Administered 2023-12-06: 324 mg via ORAL
  Filled 2023-12-06: qty 4

## 2023-12-06 MED ORDER — LACTATED RINGERS IV BOLUS
1000.0000 mL | Freq: Once | INTRAVENOUS | Status: AC
Start: 2023-12-06 — End: 2023-12-06
  Administered 2023-12-06: 1000 mL via INTRAVENOUS

## 2023-12-06 MED ORDER — CHLORDIAZEPOXIDE HCL 25 MG PO CAPS
50.0000 mg | ORAL_CAPSULE | Freq: Once | ORAL | Status: AC
  Administered 2023-12-06: 50 mg via ORAL
  Filled 2023-12-06: qty 2

## 2023-12-06 MED ORDER — IOHEXOL 350 MG/ML SOLN
75.0000 mL | Freq: Once | INTRAVENOUS | Status: AC | PRN
  Administered 2023-12-06: 75 mL via INTRAVENOUS

## 2023-12-06 NOTE — ED Provider Notes (Signed)
 [place] EMERGENCY DEPARTMENT AT [facility] Provider Note   CSN: [id number] Arrival date & time: 12/06/23  7425     History {Add pertinent medical, surgical, social history, OB history to HPI:1}   Tony Calderon is a 63 y.o. male.     Patient states he has a history of seizures.  Patient states he has not seen a neurologist.  He does drink alcohol heavily and has not had anything to drink in the last couple of days.  Patient states he has had withdrawal symptoms in the past and thinks the seizure may have been from that.  Patient states however he is also feeling short of breath.  He is not having any fevers or chills.  No chest pain.  No leg swelling.  Home Medications Prior to Admission medications   Medication Sig Start Date End Date Taking? Authorizing Provider  budesonide -formoterol  (SYMBICORT ) 160-4.5 MCG/ACT inhaler Inhale 2 puffs into the lungs 2 (two) times daily. 06/08/23   Burnette, Jennifer M, PA-C  chlordiazePOXIDE  (LIBRIUM ) 25 MG capsule 50mg  PO TID x 1D, then 25-50mg  PO BID X 1D, then 25-50mg  PO QD X 1D 06/23/23   Mordecai Applebaum, MD  cyclobenzaprine  (FLEXERIL ) 5 MG tablet Take 1-2 tablets (5-10 mg total) by mouth 2 (two) times daily as needed for muscle spasms. 12/28/18   [name], [name], PA-C  diphenhydrAMINE (BENADRYL) 25 mg capsule Take 25 mg by mouth every 6 (six) hours as needed.     [provider]  hydrOXYzine  (ATARAX /VISTARIL ) 25 MG tablet Take 1 tablet (25 mg total) by mouth every 6 (six) hours as needed for anxiety. 08/09/19   [name], [name], PA-C  lisinopril  (ZESTRIL ) 10 MG tablet Take 1 tablet (10 mg total) by mouth daily. 10/25/19 12/24/19  Twilla Galea, MD  loratadine (CLARITIN) 10 MG tablet Take 10 mg by mouth daily.    [provider]  Multiple Vitamin (MULTIVITAMIN) tablet Take 1 tablet by mouth daily.    [provider]  omeprazole  (PRILOSEC) 20 MG capsule Take 1 capsule (20 mg total) by mouth daily.  03/08/18 04/07/18  Jacquie Maudlin, MD  prednisoLONE  acetate (PRED FORTE ) 1 % ophthalmic suspension Place 1 drop into the right eye 4 (four) times daily. 12/13/18   Stephany Ehrich, MD  promethazine -dextromethorphan  (PROMETHAZINE -DM) 6.25-15 MG/5ML syrup Take 5 mLs by mouth 4 (four) times daily as needed. 06/08/23   Angelia Kelp, PA-C  sucralfate  (CARAFATE ) 1 GM/10ML suspension Take 10 mLs (1 g total) by mouth 4 (four) times daily. 10/25/19 10/24/20  Twilla Galea, MD      Allergies    Patient has no known allergies.    Review of Systems   Review of Systems  Neurological:  Positive for seizures.    Physical Exam Updated Vital Signs BP (!) 161/116   Pulse (!) 114   Temp 98.6 F (37 C) (Oral)   Resp 18   SpO2 98%  Physical Exam Vitals and nursing note reviewed.  Constitutional:      Appearance: He is well-developed.  HENT:     Head: Normocephalic and atraumatic.     Right Ear: External ear normal.     Left Ear: External ear normal.  Eyes:     General: No scleral icterus.       Right eye: No discharge.        Left eye: No discharge.     Conjunctiva/sclera: Conjunctivae normal.  Neck:     Trachea: No tracheal deviation.  Cardiovascular:     Rate and Rhythm: Regular rhythm. Tachycardia present.  Pulmonary:     Effort: Pulmonary effort is normal. No respiratory distress.     Breath sounds: Normal breath sounds. No stridor. No wheezing or rales.  Abdominal:     General: Bowel sounds are normal. There is no distension.     Palpations: Abdomen is soft.     Tenderness: There is no abdominal tenderness. There is no guarding or rebound.  Musculoskeletal:        General: No tenderness or deformity.     Cervical back: Neck supple.     Right lower leg: No edema.     Left lower leg: No edema.  Skin:    General: Skin is warm and dry.     Findings: No rash.  Neurological:     General: No focal deficit present.     Mental Status: He is alert.     Cranial Nerves: No  cranial nerve deficit, dysarthria or facial asymmetry.     Sensory: No sensory deficit.     Motor: No tremor, abnormal muscle tone or seizure activity.     Coordination: Coordination normal.  Psychiatric:        Mood and Affect: Mood normal.     ED Results / Procedures / Treatments   Labs (all labs ordered are listed, but only abnormal results are displayed) Labs Reviewed  CBC  COMPREHENSIVE METABOLIC PANEL WITH GFR  MAGNESIUM  BRAIN NATRIURETIC PEPTIDE  D-DIMER, QUANTITATIVE  TROPONIN I (HIGH SENSITIVITY)    EKG EKG Interpretation Date/Time:  Sunday Dec 06 2023 08:24:51 EDT Ventricular Rate:  111 PR Interval:  169 QRS Duration:  158 QT Interval:  362 QTC Calculation: 492 R Axis:   31  Text Interpretation: Sinus tachycardia Left bundle branch block Since last tracing rate faster Confirmed by Trish Furl [phone]) on 12/06/2023 8:32:33 AM  Radiology No results found.  Procedures Procedures  {Document cardiac monitor, telemetry assessment procedure when appropriate:1}  Medications Ordered in ED Medications  aspirin chewable tablet 324 mg (has no administration in time range)  lactated ringers bolus 1,000 mL (has no administration in time range)  LORazepam  (ATIVAN ) injection 1 mg (has no administration in time range)    ED Course/ Medical Decision Making/ A&P   {   Click here for ABCD2, HEART and other calculatorsREFRESH Note before signing :1}                              Medical Decision Making Amount and/or Complexity of Data Reviewed Labs: ordered. Radiology: ordered.  Risk OTC drugs. Prescription drug management.   ***  {Document critical care time when appropriate:1} {Document review of labs and clinical decision tools ie heart score, Chads2Vasc2 etc:1}  {Document your independent review of radiology images, and any outside records:1} {Document your discussion with family members, caretakers, and with consultants:1} {Document social determinants of  health affecting pt's care:1} {Document your decision making why or why not admission, treatments were needed:1} Final Clinical Impression(s) / ED Diagnoses Final diagnoses:  None    Rx / DC Orders ED Discharge Orders     None

## 2023-12-06 NOTE — ED Triage Notes (Signed)
 Pt came in for seizure. Pt reports seizure this morning around 0430 this morning which he believes to be from alcohol withdrawal.  Pt reports drinking 2-10 beers per day.  Pt says his last drink was Thursday night.

## 2023-12-06 NOTE — ED Notes (Signed)
 Patient transported to CT

## 2023-12-06 NOTE — ED Provider Notes (Signed)
 Signout from Dr. Monnie Anthony.  63 year old male with significant alcohol use here after concerns for probable seizure.  Also feeling short of breath.  Has improved with fluids and Ativan .  D-dimer elevated and is pending CT angio chest.  If angio negative and withdrawal symptoms are improved likely can be outpatient follow-up. Physical Exam  BP 117/73   Pulse 89   Temp 98.7 F (37.1 C) (Oral)   Resp 13   Ht 5\' 7"  (1.702 m)   Wt 77.1 kg   SpO2 98%   BMI 26.63 kg/m   Physical Exam  Procedures  Procedures  ED Course / MDM   Clinical Course as of 12/06/23 1553  Sun Dec 06, 2023  1005 D-dimer, quantitative(!) D-dimer elevated 1.51 [JK]  1005 CBC(!) CBC normal [JK]  1005 Chest x-ray without acute findings [JK]  1023 Troponin I (High Sensitivity)(!) Opponent increased at 19.  BNP slightly elevated at 181 [JK]  1034 Comprehensive metabolic panel(!) Metabolic shows elevated bilirubin 1.6.  Otherwise normal metabolic panel [JK]  1202 Magnesium(!) Magnesium level decreased. [JK]  1202 Troponin I (High Sensitivity)(!) Second troponin slightly increased BNP slightly increased [JK]    Clinical Course User Index [JK] Trish Furl, MD   Medical Decision Making Amount and/or Complexity of Data Reviewed Labs: ordered. Decision-making details documented in ED Course. Radiology: ordered.  Risk OTC drugs. Prescription drug management.   Patient CT is negative for PE or other acute findings.  Does have compression fracture.  Reviewed with patient.  He is trying to reach out to somebody for a detox bed.

## 2023-12-12 ENCOUNTER — Emergency Department (HOSPITAL_COMMUNITY)
Admission: EM | Admit: 2023-12-12 | Discharge: 2023-12-12 | Disposition: A | Discharge status: Home / Self Care | Payer: Self-pay

## 2023-12-12 ENCOUNTER — Emergency Department (HOSPITAL_COMMUNITY): Payer: Self-pay

## 2023-12-12 ENCOUNTER — Other Ambulatory Visit: Payer: Self-pay

## 2023-12-12 ENCOUNTER — Encounter (HOSPITAL_COMMUNITY): Payer: Self-pay | Admitting: Emergency Medicine

## 2023-12-12 DIAGNOSIS — I10 Essential (primary) hypertension: Secondary | ICD-10-CM | POA: Insufficient documentation

## 2023-12-12 DIAGNOSIS — Z7951 Long term (current) use of inhaled steroids: Secondary | ICD-10-CM | POA: Insufficient documentation

## 2023-12-12 DIAGNOSIS — J45909 Unspecified asthma, uncomplicated: Secondary | ICD-10-CM | POA: Insufficient documentation

## 2023-12-12 DIAGNOSIS — K5792 Diverticulitis of intestine, part unspecified, without perforation or abscess without bleeding: Secondary | ICD-10-CM

## 2023-12-12 DIAGNOSIS — Z79899 Other long term (current) drug therapy: Secondary | ICD-10-CM | POA: Insufficient documentation

## 2023-12-12 DIAGNOSIS — K5732 Diverticulitis of large intestine without perforation or abscess without bleeding: Secondary | ICD-10-CM | POA: Insufficient documentation

## 2023-12-12 LAB — CBC WITH DIFFERENTIAL/PLATELET
Abs Immature Granulocytes: 0.04 10*3/uL (ref 0.00–0.07)
Basophils Absolute: 0.1 10*3/uL (ref 0.0–0.1)
Basophils Relative: 1 %
Eosinophils Absolute: 0.4 10*3/uL (ref 0.0–0.5)
Eosinophils Relative: 4 %
HCT: 40.6 % (ref 39.0–52.0)
Hemoglobin: 12.8 g/dL — ABNORMAL LOW (ref 13.0–17.0)
Immature Granulocytes: 0 %
Lymphocytes Relative: 14 %
Lymphs Abs: 1.6 10*3/uL (ref 0.7–4.0)
MCH: 28.4 pg (ref 26.0–34.0)
MCHC: 31.5 g/dL (ref 30.0–36.0)
MCV: 90 fL (ref 80.0–100.0)
Monocytes Absolute: 2.1 10*3/uL — ABNORMAL HIGH (ref 0.1–1.0)
Monocytes Relative: 19 %
Neutro Abs: 7.1 10*3/uL (ref 1.7–7.7)
Neutrophils Relative %: 62 %
Platelets: 324 10*3/uL (ref 150–400)
RBC: 4.51 MIL/uL (ref 4.22–5.81)
RDW: 17.2 % — ABNORMAL HIGH (ref 11.5–15.5)
WBC: 11.3 10*3/uL — ABNORMAL HIGH (ref 4.0–10.5)
nRBC: 0 % (ref 0.0–0.2)

## 2023-12-12 LAB — COMPREHENSIVE METABOLIC PANEL WITH GFR
ALT: 37 U/L (ref 0–44)
AST: 43 U/L — ABNORMAL HIGH (ref 15–41)
Albumin: 3.3 g/dL — ABNORMAL LOW (ref 3.5–5.0)
Alkaline Phosphatase: 48 U/L (ref 38–126)
Anion gap: 8 (ref 5–15)
BUN: 5 mg/dL — ABNORMAL LOW (ref 6–20)
CO2: 23 mmol/L (ref 22–32)
Calcium: 9.2 mg/dL (ref 8.9–10.3)
Chloride: 103 mmol/L (ref 98–111)
Creatinine, Ser: 0.74 mg/dL (ref 0.61–1.24)
GFR, Estimated: >60 mL/min (ref >60)
Glucose, Bld: 104 mg/dL — ABNORMAL HIGH (ref 70–99)
Potassium: 3.7 mmol/L (ref 3.5–5.1)
Sodium: 134 mmol/L — ABNORMAL LOW (ref 135–145)
Total Bilirubin: 0.6 mg/dL (ref 0.0–1.2)
Total Protein: 7.2 g/dL (ref 6.5–8.1)

## 2023-12-12 LAB — URINALYSIS, ROUTINE W REFLEX MICROSCOPIC
Bilirubin Urine: NEGATIVE
Glucose, UA: NEGATIVE mg/dL
Hgb urine dipstick: NEGATIVE
Ketones, ur: NEGATIVE mg/dL
Leukocytes,Ua: NEGATIVE
Nitrite: NEGATIVE
Protein, ur: NEGATIVE mg/dL
Specific Gravity, Urine: 1.006 (ref 1.005–1.030)
pH: 6 (ref 5.0–8.0)

## 2023-12-12 MED ORDER — SODIUM CHLORIDE 0.9 % IV BOLUS
1000.0000 mL | Freq: Once | INTRAVENOUS | Status: AC
  Administered 2023-12-12: 1000 mL via INTRAVENOUS

## 2023-12-12 MED ORDER — IOHEXOL 350 MG/ML SOLN
75.0000 mL | Freq: Once | INTRAVENOUS | Status: AC | PRN
  Administered 2023-12-12: 75 mL via INTRAVENOUS

## 2023-12-12 MED ORDER — FENTANYL CITRATE PF 50 MCG/ML IJ SOSY
50.0000 ug | PREFILLED_SYRINGE | Freq: Once | INTRAMUSCULAR | Status: AC
  Administered 2023-12-12: 50 ug via INTRAVENOUS
  Filled 2023-12-12: qty 1

## 2023-12-12 MED ORDER — AMOXICILLIN-POT CLAVULANATE 875-125 MG PO TABS: 1.0000 | ORAL_TABLET | Freq: Two times a day (BID) | ORAL | 0 refills | Status: DC

## 2023-12-12 MED ORDER — OXYCODONE HCL 5 MG PO TABS: 5.0000 mg | ORAL_TABLET | ORAL | 0 refills | Status: DC | PRN

## 2023-12-12 MED ORDER — ONDANSETRON HCL 4 MG/2ML IJ SOLN
4.0000 mg | Freq: Once | INTRAMUSCULAR | Status: AC
  Administered 2023-12-12: 4 mg via INTRAVENOUS
  Filled 2023-12-12: qty 2

## 2023-12-12 MED ORDER — AMOXICILLIN-POT CLAVULANATE 875-125 MG PO TABS
1.0000 | ORAL_TABLET | Freq: Once | ORAL | Status: AC
  Administered 2023-12-12: 1 via ORAL
  Filled 2023-12-12: qty 1

## 2023-12-12 NOTE — Discharge Instructions (Addendum)
 You have diverticulitis.  I have sent antibiotic and pain medicine into the pharmacy.  Follow-up with your primary care doctor.  Return for any emergent symptoms.  Make sure you have a good bowel regimen to keep from straining or getting constipated. Ultrasound to measure any concerning findings.  Blood work was otherwise reassuring.  He also has some thickening of the esophagus.  This is not concerning however the radiologist recommended follow-up with gastroenterologist for potential endoscopy.  I have listed a gastroenterologist above.

## 2023-12-12 NOTE — ED Notes (Signed)
 Patient transported to Ultrasound

## 2023-12-12 NOTE — ED Notes (Signed)
 Pt. given beverage of choice upon discharge. Ambulatory and seen walking out of dept.

## 2023-12-12 NOTE — ED Provider Notes (Signed)
 [place] EMERGENCY DEPARTMENT AT [facility] Provider Note   CSN: [id number] Arrival date & time: 12/12/23  1046     History  Chief Complaint  Patient presents with   Groin Pain   Groin Swelling    [name] is a 63 y.o. male.  63 year old male presents today for concern of left lower abdominal pain as well as right testicular swelling and pain.  Symptoms started yesterday.  He states the testicular swelling and pain has mostly resolved.  Denies history of kidney stone.  No hematuria, dysuria.  Denies any flank pain.  Denies history of the same.  Denies any nausea or vomiting.  The history is provided by the patient. No language interpreter was used.       Home Medications Prior to Admission medications   Medication Sig Start Date End Date Taking? Authorizing Provider  budesonide -formoterol  (SYMBICORT ) 160-4.5 MCG/ACT inhaler Inhale 2 puffs into the lungs 2 (two) times daily. 06/08/23   Angelia Kelp, PA-C  chlordiazePOXIDE  (LIBRIUM ) 25 MG capsule 50mg  PO TID x 1D, then 25-50mg  PO BID X 1D, then 25-50mg  PO QD X 1D 12/06/23   Tonya Fredrickson, MD  cyclobenzaprine  (FLEXERIL ) 5 MG tablet Take 1-2 tablets (5-10 mg total) by mouth 2 (two) times daily as needed for muscle spasms. 12/28/18   [name], [name], PA-C  diphenhydrAMINE (BENADRYL) 25 mg capsule Take 25 mg by mouth every 6 (six) hours as needed.     [provider]  hydrOXYzine  (ATARAX /VISTARIL ) 25 MG tablet Take 1 tablet (25 mg total) by mouth every 6 (six) hours as needed for anxiety. 08/09/19   [name], [name], PA-C  lisinopril  (ZESTRIL ) 10 MG tablet Take 1 tablet (10 mg total) by mouth daily. 10/25/19 12/24/19  Twilla Galea, MD  loratadine (CLARITIN) 10 MG tablet Take 10 mg by mouth daily.    [provider]  Multiple Vitamin (MULTIVITAMIN) tablet Take 1 tablet by mouth daily.    [provider]  omeprazole  (PRILOSEC) 20 MG capsule Take 1 capsule (20 mg total) by mouth  daily. 03/08/18 04/07/18  Jacquie Maudlin, MD  prednisoLONE  acetate (PRED FORTE ) 1 % ophthalmic suspension Place 1 drop into the right eye 4 (four) times daily. 12/13/18   Stephany Ehrich, MD  promethazine -dextromethorphan  (PROMETHAZINE -DM) 6.25-15 MG/5ML syrup Take 5 mLs by mouth 4 (four) times daily as needed. 06/08/23   Angelia Kelp, PA-C  sucralfate  (CARAFATE ) 1 GM/10ML suspension Take 10 mLs (1 g total) by mouth 4 (four) times daily. 10/25/19 10/24/20  Twilla Galea, MD      Allergies    Patient has no known allergies.    Review of Systems   Review of Systems  Constitutional:  Negative for chills and fever.  Respiratory:  Negative for shortness of breath.   Gastrointestinal:  Positive for abdominal pain. Negative for nausea and vomiting.  Genitourinary:  Negative for dysuria and hematuria.  All other systems reviewed and are negative.   Physical Exam Updated Vital Signs BP (!) 150/97 (BP Location: Right Arm)   Pulse 92   Temp 98.2 F (36.8 C)   Resp (!) 22   Ht 5\' 7"  (1.702 m)   Wt 79.4 kg   SpO2 98%   BMI 27.41 kg/m  Physical Exam Vitals and nursing note reviewed.  Constitutional:      General: He is not in acute distress.    Appearance: Normal appearance. He is not ill-appearing.  HENT:     Head:  Normocephalic and atraumatic.     Nose: Nose normal.  Eyes:     Conjunctiva/sclera: Conjunctivae normal.  Cardiovascular:     Rate and Rhythm: Normal rate and regular rhythm.  Pulmonary:     Effort: Pulmonary effort is normal. No respiratory distress.  Abdominal:     General: There is no distension.     Tenderness: There is abdominal tenderness. There is no right CVA tenderness, left CVA tenderness or guarding.  Genitourinary:    Comments: Chaperone deferred by patient.  No swelling, erythema, or tenderness on exam. Musculoskeletal:        General: No deformity. Normal range of motion.     Cervical back: Normal range of motion.  Skin:    Findings: No rash.   Neurological:     Mental Status: He is alert.     ED Results / Procedures / Treatments   Labs (all labs ordered are listed, but only abnormal results are displayed) Labs Reviewed  CBC WITH DIFFERENTIAL/PLATELET - Abnormal; Notable for the following components:      Result Value   WBC 11.3 (*)    Hemoglobin 12.8 (*)    RDW 17.2 (*)    Monocytes Absolute 2.1 (*)    All other components within normal limits  COMPREHENSIVE METABOLIC PANEL WITH GFR - Abnormal; Notable for the following components:   Sodium 134 (*)    Glucose, Bld 104 (*)    BUN 5 (*)    Albumin 3.3 (*)    AST 43 (*)    All other components within normal limits  URINALYSIS, ROUTINE W REFLEX MICROSCOPIC - Abnormal; Notable for the following components:   Color, Urine STRAW (*)    All other components within normal limits  LIPASE, BLOOD    EKG None  Radiology No results found.  Procedures Procedures    Medications Ordered in ED Medications  ondansetron  (ZOFRAN ) injection 4 mg (has no administration in time range)  fentaNYL (SUBLIMAZE) injection 50 mcg (has no administration in time range)  sodium chloride  0.9 % bolus 1,000 mL (has no administration in time range)    ED Course/ Medical Decision Making/ A&P                                 Medical Decision Making Amount and/or Complexity of Data Reviewed Labs: ordered. Radiology: ordered.  Risk Prescription drug management.   Medical Decision Making / ED Course   This patient presents to the ED for concern of abdominal pain, testicular pain this involves an extensive number of treatment options, and is a complaint that carries with it a high risk of complications and morbidity.  The differential diagnosis includes testicular torsion, epididymitis, orchitis, diverticulitis, pyelonephritis, nephrolithiasis, appendicitis  MDM: 63 year old male presents today for concern of lower abdominal pain as well as right testicular pain and swelling.  The  testicular pain and swelling resolved.  Will evaluate with ultrasound and if this is negative will obtain CT.  CBC with no leukocytosis, no significant anemia.  CMP without acute concerns.  UA without evidence of UTI.  Ultrasound of the scrotum without evidence of torsion or other acute process.  CT abdomen pelvis shows diverticulitis without any complicating features.  Augmentin dose given in the ED.  Augmentin prescribed.  Pain medication given for short course.  Discharged in stable condition.   Lab Tests: -I ordered, reviewed, and interpreted labs.   The pertinent results include:  Labs Reviewed  CBC WITH DIFFERENTIAL/PLATELET - Abnormal; Notable for the following components:      Result Value   WBC 11.3 (*)    Hemoglobin 12.8 (*)    RDW 17.2 (*)    Monocytes Absolute 2.1 (*)    All other components within normal limits  COMPREHENSIVE METABOLIC PANEL WITH GFR - Abnormal; Notable for the following components:   Sodium 134 (*)    Glucose, Bld 104 (*)    BUN 5 (*)    Albumin 3.3 (*)    AST 43 (*)    All other components within normal limits  URINALYSIS, ROUTINE W REFLEX MICROSCOPIC - Abnormal; Notable for the following components:   Color, Urine STRAW (*)    All other components within normal limits  LIPASE, BLOOD      EKG  EKG Interpretation Date/Time:    Ventricular Rate:    PR Interval:    QRS Duration:    QT Interval:    QTC Calculation:   R Axis:      Text Interpretation:           Imaging Studies ordered: I ordered imaging studies including ultrasound of the scrotum, CT abdomen pelvis with contrast I independently visualized and interpreted imaging. I agree with the radiologist interpretation   Medicines ordered and prescription drug management: Meds ordered this encounter  Medications   ondansetron  (ZOFRAN ) injection 4 mg   fentaNYL (SUBLIMAZE) injection 50 mcg   sodium chloride  0.9 % bolus 1,000 mL   iohexol  (OMNIPAQUE ) 350 MG/ML injection  75 mL   amoxicillin-clavulanate (AUGMENTIN) 875-125 MG per tablet 1 tablet   oxyCODONE (ROXICODONE) 5 MG immediate release tablet    Sig: Take 1 tablet (5 mg total) by mouth every 4 (four) hours as needed for severe pain (pain score 7-10).    Dispense:  10 tablet    Refill:  0    Supervising Provider:   Early Glisson [[id number]]   amoxicillin-clavulanate (AUGMENTIN) 875-125 MG tablet    Sig: Take 1 tablet by mouth every 12 (twelve) hours.    Dispense:  14 tablet    Refill:  0    Supervising Provider:   Annabell Key, BRIAN [[id number]]    -I have reviewed the patients home medicines and have made adjustments as needed    Reevaluation: After the interventions noted above, I reevaluated the patient and found that they have :improved  Co morbidities that complicate the patient evaluation  Past Medical History:  Diagnosis Date   Allergy    Asthma    Hypertension    Seizures (HCC)       Dispostion: Discharged in stable condition.   Final Clinical Impression(s) / ED Diagnoses Final diagnoses:  Diverticulitis    Rx / DC Orders ED Discharge Orders          Ordered    oxyCODONE (ROXICODONE) 5 MG immediate release tablet  Every 4 hours PRN        12/12/23 1452    amoxicillin-clavulanate (AUGMENTIN) 875-125 MG tablet  Every 12 hours        12/12/23 1452              Lucina Sabal, PA-C 12/12/23 1455    Carin Charleston, MD 12/12/23 2045

## 2023-12-12 NOTE — ED Notes (Signed)
 Patient transported to ultrasound.

## 2023-12-12 NOTE — ED Triage Notes (Signed)
 Pt reports left lower abdominal pain and testicular swelling on the right. Pt making urine. Reports subjective fever. VSS.

## 2024-05-08 ENCOUNTER — Emergency Department (HOSPITAL_COMMUNITY): Payer: Self-pay

## 2024-05-08 ENCOUNTER — Inpatient Hospital Stay (HOSPITAL_COMMUNITY)
Admission: EM | Admit: 2024-05-08 | Discharge: 2024-05-12 | DRG: 208 | Disposition: A | Discharge status: Home / Self Care | Payer: Self-pay | Attending: Internal Medicine | Admitting: Internal Medicine

## 2024-05-08 ENCOUNTER — Encounter (HOSPITAL_COMMUNITY): Payer: Self-pay | Admitting: Emergency Medicine

## 2024-05-08 DIAGNOSIS — D649 Anemia, unspecified: Secondary | ICD-10-CM | POA: Diagnosis not present

## 2024-05-08 DIAGNOSIS — Z8249 Family history of ischemic heart disease and other diseases of the circulatory system: Secondary | ICD-10-CM

## 2024-05-08 DIAGNOSIS — J4489 Other specified chronic obstructive pulmonary disease: Secondary | ICD-10-CM | POA: Diagnosis present

## 2024-05-08 DIAGNOSIS — I110 Hypertensive heart disease with heart failure: Secondary | ICD-10-CM | POA: Diagnosis present

## 2024-05-08 DIAGNOSIS — F1910 Other psychoactive substance abuse, uncomplicated: Principal | ICD-10-CM

## 2024-05-08 DIAGNOSIS — I469 Cardiac arrest, cause unspecified: Secondary | ICD-10-CM | POA: Diagnosis present

## 2024-05-08 DIAGNOSIS — I447 Left bundle-branch block, unspecified: Secondary | ICD-10-CM

## 2024-05-08 DIAGNOSIS — R4020 Unspecified coma: Secondary | ICD-10-CM | POA: Diagnosis present

## 2024-05-08 DIAGNOSIS — F1410 Cocaine abuse, uncomplicated: Secondary | ICD-10-CM | POA: Diagnosis present

## 2024-05-08 DIAGNOSIS — Z79899 Other long term (current) drug therapy: Secondary | ICD-10-CM

## 2024-05-08 DIAGNOSIS — F10120 Alcohol abuse with intoxication, uncomplicated: Secondary | ICD-10-CM | POA: Diagnosis present

## 2024-05-08 DIAGNOSIS — Z87891 Personal history of nicotine dependence: Secondary | ICD-10-CM

## 2024-05-08 DIAGNOSIS — J449 Chronic obstructive pulmonary disease, unspecified: Secondary | ICD-10-CM | POA: Diagnosis present

## 2024-05-08 DIAGNOSIS — E871 Hypo-osmolality and hyponatremia: Secondary | ICD-10-CM | POA: Diagnosis present

## 2024-05-08 DIAGNOSIS — E876 Hypokalemia: Secondary | ICD-10-CM | POA: Diagnosis not present

## 2024-05-08 DIAGNOSIS — E872 Acidosis, unspecified: Secondary | ICD-10-CM | POA: Diagnosis present

## 2024-05-08 DIAGNOSIS — R062 Wheezing: Secondary | ICD-10-CM

## 2024-05-08 DIAGNOSIS — F1310 Sedative, hypnotic or anxiolytic abuse, uncomplicated: Secondary | ICD-10-CM | POA: Diagnosis present

## 2024-05-08 DIAGNOSIS — F10920 Alcohol use, unspecified with intoxication, uncomplicated: Secondary | ICD-10-CM

## 2024-05-08 DIAGNOSIS — Z5982 Transportation insecurity: Secondary | ICD-10-CM

## 2024-05-08 DIAGNOSIS — G9340 Encephalopathy, unspecified: Secondary | ICD-10-CM

## 2024-05-08 DIAGNOSIS — I5021 Acute systolic (congestive) heart failure: Secondary | ICD-10-CM | POA: Diagnosis present

## 2024-05-08 DIAGNOSIS — J9601 Acute respiratory failure with hypoxia: Principal | ICD-10-CM | POA: Diagnosis present

## 2024-05-08 DIAGNOSIS — I471 Supraventricular tachycardia, unspecified: Secondary | ICD-10-CM | POA: Diagnosis not present

## 2024-05-08 DIAGNOSIS — E162 Hypoglycemia, unspecified: Secondary | ICD-10-CM | POA: Diagnosis not present

## 2024-05-08 DIAGNOSIS — Z781 Physical restraint status: Secondary | ICD-10-CM

## 2024-05-08 DIAGNOSIS — Z7951 Long term (current) use of inhaled steroids: Secondary | ICD-10-CM

## 2024-05-08 DIAGNOSIS — Y908 Blood alcohol level of 240 mg/100 ml or more: Secondary | ICD-10-CM | POA: Diagnosis present

## 2024-05-08 DIAGNOSIS — F1010 Alcohol abuse, uncomplicated: Secondary | ICD-10-CM

## 2024-05-08 LAB — I-STAT CHEM 8, ED
BUN: 4 mg/dL — ABNORMAL LOW (ref 8–23)
Calcium, Ion: 1 mmol/L — ABNORMAL LOW (ref 1.15–1.40)
Chloride: 101 mmol/L (ref 98–111)
Creatinine, Ser: 1 mg/dL (ref 0.61–1.24)
Glucose, Bld: 111 mg/dL — ABNORMAL HIGH (ref 70–99)
HCT: 46 % (ref 39.0–52.0)
Hemoglobin: 15.6 g/dL (ref 13.0–17.0)
Potassium: 3.9 mmol/L (ref 3.5–5.1)
Sodium: 138 mmol/L (ref 135–145)
TCO2: 22 mmol/L (ref 22–32)

## 2024-05-08 LAB — I-STAT ARTERIAL BLOOD GAS, ED
Acid-base deficit: 5 mmol/L — ABNORMAL HIGH (ref 0.0–2.0)
Bicarbonate: 22.8 mmol/L (ref 20.0–28.0)
Calcium, Ion: 1.07 mmol/L — ABNORMAL LOW (ref 1.15–1.40)
HCT: 37 % — ABNORMAL LOW (ref 39.0–52.0)
Hemoglobin: 12.6 g/dL — ABNORMAL LOW (ref 13.0–17.0)
O2 Saturation: 100 %
Patient temperature: 98.6
Potassium: 3.7 mmol/L (ref 3.5–5.1)
Sodium: 138 mmol/L (ref 135–145)
TCO2: 24 mmol/L (ref 22–32)
pCO2 arterial: 50.8 mmHg — ABNORMAL HIGH (ref 32–48)
pH, Arterial: 7.26 — ABNORMAL LOW (ref 7.35–7.45)
pO2, Arterial: 539 mmHg — ABNORMAL HIGH (ref 83–108)

## 2024-05-08 LAB — CBC WITH DIFFERENTIAL/PLATELET
Abs Immature Granulocytes: 0.37 K/uL — ABNORMAL HIGH (ref 0.00–0.07)
Basophils Absolute: 0.2 K/uL — ABNORMAL HIGH (ref 0.0–0.1)
Basophils Relative: 2 %
Eosinophils Absolute: 0.6 K/uL — ABNORMAL HIGH (ref 0.0–0.5)
Eosinophils Relative: 6 %
HCT: 43.7 % (ref 39.0–52.0)
Hemoglobin: 13.7 g/dL (ref 13.0–17.0)
Immature Granulocytes: 4 %
Lymphocytes Relative: 45 %
Lymphs Abs: 4.2 K/uL — ABNORMAL HIGH (ref 0.7–4.0)
MCH: 29.3 pg (ref 26.0–34.0)
MCHC: 31.4 g/dL (ref 30.0–36.0)
MCV: 93.4 fL (ref 80.0–100.0)
Monocytes Absolute: 1.2 K/uL — ABNORMAL HIGH (ref 0.1–1.0)
Monocytes Relative: 13 %
Neutro Abs: 2.8 K/uL (ref 1.7–7.7)
Neutrophils Relative %: 30 %
Platelets: 231 K/uL (ref 150–400)
RBC: 4.68 MIL/uL (ref 4.22–5.81)
RDW: 16.4 % — ABNORMAL HIGH (ref 11.5–15.5)
WBC: 9.3 K/uL (ref 4.0–10.5)
nRBC: 0.2 % (ref 0.0–0.2)

## 2024-05-08 LAB — COMPREHENSIVE METABOLIC PANEL WITH GFR
ALT: 43 U/L (ref 0–44)
AST: 119 U/L — ABNORMAL HIGH (ref 15–41)
Albumin: 3.2 g/dL — ABNORMAL LOW (ref 3.5–5.0)
Alkaline Phosphatase: 69 U/L (ref 38–126)
Anion gap: 15 (ref 5–15)
BUN: 5 mg/dL — ABNORMAL LOW (ref 8–23)
CO2: 21 mmol/L — ABNORMAL LOW (ref 22–32)
Calcium: 8.1 mg/dL — ABNORMAL LOW (ref 8.9–10.3)
Chloride: 101 mmol/L (ref 98–111)
Creatinine, Ser: 0.87 mg/dL (ref 0.61–1.24)
GFR, Estimated: >60 mL/min (ref >60)
Glucose, Bld: 113 mg/dL — ABNORMAL HIGH (ref 70–99)
Potassium: 3.8 mmol/L (ref 3.5–5.1)
Sodium: 137 mmol/L (ref 135–145)
Total Bilirubin: 0.5 mg/dL (ref 0.0–1.2)
Total Protein: 7.4 g/dL (ref 6.5–8.1)

## 2024-05-08 LAB — AMMONIA: Ammonia: 54 umol/L — ABNORMAL HIGH (ref 9–35)

## 2024-05-08 LAB — MAGNESIUM: Magnesium: 2.1 mg/dL (ref 1.7–2.4)

## 2024-05-08 LAB — URINALYSIS, ROUTINE W REFLEX MICROSCOPIC
Bilirubin Urine: NEGATIVE
Glucose, UA: NEGATIVE mg/dL
Ketones, ur: NEGATIVE mg/dL
Leukocytes,Ua: NEGATIVE
Nitrite: NEGATIVE
Protein, ur: 30 mg/dL — AB
Specific Gravity, Urine: 1.005 (ref 1.005–1.030)
pH: 5 (ref 5.0–8.0)

## 2024-05-08 LAB — RAPID URINE DRUG SCREEN, HOSP PERFORMED
Amphetamines: NOT DETECTED
Barbiturates: NOT DETECTED
Benzodiazepines: POSITIVE — AB
Cocaine: POSITIVE — AB
Opiates: NOT DETECTED
Tetrahydrocannabinol: NOT DETECTED

## 2024-05-08 LAB — CBG MONITORING, ED: Glucose-Capillary: 115 mg/dL — ABNORMAL HIGH (ref 70–99)

## 2024-05-08 LAB — TROPONIN I (HIGH SENSITIVITY): Troponin I (High Sensitivity): 17 ng/L (ref <18)

## 2024-05-08 LAB — PROTIME-INR
INR: 1 (ref 0.8–1.2)
Prothrombin Time: 13.5 s (ref 11.4–15.2)

## 2024-05-08 LAB — I-STAT CG4 LACTIC ACID, ED: Lactic Acid, Venous: 4.8 mmol/L (ref 0.5–1.9)

## 2024-05-08 LAB — ETHANOL: Alcohol, Ethyl (B): 293 mg/dL — ABNORMAL HIGH (ref <15)

## 2024-05-08 MED ORDER — SODIUM CHLORIDE 0.9 % IV SOLN: 250.0000 mL | INTRAVENOUS | Status: AC

## 2024-05-08 MED ORDER — ORAL CARE MOUTH RINSE
15.0000 mL | OROMUCOSAL | Status: DC
  Administered 2024-05-09 (×6): 15 mL via OROMUCOSAL

## 2024-05-08 MED ORDER — ACETAMINOPHEN 650 MG RE SUPP: 650.0000 mg | RECTAL | Status: DC

## 2024-05-08 MED ORDER — FAMOTIDINE 20 MG PO TABS
20.0000 mg | ORAL_TABLET | Freq: Two times a day (BID) | ORAL | Status: DC
Start: 2024-05-08 — End: 2024-05-09
  Administered 2024-05-08 – 2024-05-09 (×2): 20 mg
  Filled 2024-05-08 (×2): qty 1

## 2024-05-08 MED ORDER — ACETAMINOPHEN 160 MG/5ML PO SOLN: 650.0000 mg | ORAL | Status: DC | PRN

## 2024-05-08 MED ORDER — THIAMINE HCL 100 MG/ML IJ SOLN
100.0000 mg | Freq: Three times a day (TID) | INTRAMUSCULAR | Status: DC
  Administered 2024-05-10 – 2024-05-12 (×5): 100 mg via INTRAVENOUS
  Filled 2024-05-08 (×5): qty 2

## 2024-05-08 MED ORDER — DOCUSATE SODIUM 50 MG/5ML PO LIQD
100.0000 mg | Freq: Two times a day (BID) | ORAL | Status: DC
Start: 2024-05-08 — End: 2024-05-09
  Administered 2024-05-08: 100 mg
  Filled 2024-05-08: qty 10

## 2024-05-08 MED ORDER — ACETAMINOPHEN 160 MG/5ML PO SOLN
650.0000 mg | ORAL | Status: DC
  Administered 2024-05-08 – 2024-05-09 (×2): 650 mg
  Filled 2024-05-08 (×2): qty 20.3

## 2024-05-08 MED ORDER — PANTOPRAZOLE SODIUM 40 MG IV SOLR
40.0000 mg | Freq: Every day | INTRAVENOUS | Status: DC
Start: 2024-05-08 — End: 2024-05-08

## 2024-05-08 MED ORDER — CHLORHEXIDINE GLUCONATE CLOTH 2 % EX PADS
6.0000 | MEDICATED_PAD | Freq: Every day | CUTANEOUS | Status: DC
  Administered 2024-05-09 – 2024-05-11 (×3): 6 via TOPICAL

## 2024-05-08 MED ORDER — ACETAMINOPHEN 325 MG PO TABS: 650.0000 mg | ORAL_TABLET | ORAL | Status: DC | PRN

## 2024-05-08 MED ORDER — THIAMINE HCL 100 MG/ML IJ SOLN
500.0000 mg | Freq: Three times a day (TID) | INTRAVENOUS | Status: AC
  Administered 2024-05-09 – 2024-05-10 (×6): 500 mg via INTRAVENOUS
  Filled 2024-05-08: qty 2
  Filled 2024-05-08 (×3): qty 5
  Filled 2024-05-08: qty 500
  Filled 2024-05-08 (×2): qty 5

## 2024-05-08 MED ORDER — SODIUM CHLORIDE 0.9 % IV BOLUS
1000.0000 mL | Freq: Once | INTRAVENOUS | Status: AC
  Administered 2024-05-08: 1000 mL via INTRAVENOUS

## 2024-05-08 MED ORDER — ACETAMINOPHEN 325 MG PO TABS
650.0000 mg | ORAL_TABLET | ORAL | Status: DC
  Administered 2024-05-09: 650 mg via ORAL
  Filled 2024-05-08: qty 2

## 2024-05-08 MED ORDER — PROPOFOL 1000 MG/100ML IV EMUL
0.0000 ug/kg/min | INTRAVENOUS | Status: DC
  Administered 2024-05-08: 20 ug/kg/min via INTRAVENOUS
  Administered 2024-05-09: 50 ug/kg/min via INTRAVENOUS
  Filled 2024-05-08 (×3): qty 100

## 2024-05-08 MED ORDER — FENTANYL CITRATE PF 50 MCG/ML IJ SOSY: 50.0000 ug | PREFILLED_SYRINGE | INTRAMUSCULAR | Status: DC | PRN

## 2024-05-08 MED ORDER — ORAL CARE MOUTH RINSE: 15.0000 mL | OROMUCOSAL | Status: DC | PRN

## 2024-05-08 MED ORDER — HEPARIN SODIUM (PORCINE) 5000 UNIT/ML IJ SOLN
5000.0000 [IU] | Freq: Three times a day (TID) | INTRAMUSCULAR | Status: DC
  Administered 2024-05-08 – 2024-05-12 (×10): 5000 [IU] via SUBCUTANEOUS
  Filled 2024-05-08 (×11): qty 1

## 2024-05-08 MED ORDER — NOREPINEPHRINE 4 MG/250ML-% IV SOLN: 0.0000 ug/min | INTRAVENOUS | Status: DC

## 2024-05-08 MED ORDER — FENTANYL CITRATE PF 50 MCG/ML IJ SOSY
50.0000 ug | PREFILLED_SYRINGE | INTRAMUSCULAR | Status: AC | PRN
  Administered 2024-05-08 – 2024-05-09 (×3): 50 ug via INTRAVENOUS
  Filled 2024-05-08 (×3): qty 1

## 2024-05-08 MED ORDER — ONDANSETRON HCL 4 MG/2ML IJ SOLN
4.0000 mg | Freq: Four times a day (QID) | INTRAMUSCULAR | Status: DC | PRN
  Administered 2024-05-09: 4 mg via INTRAVENOUS
  Filled 2024-05-08: qty 2

## 2024-05-08 MED ORDER — ACETAMINOPHEN 650 MG RE SUPP: 650.0000 mg | RECTAL | Status: DC | PRN

## 2024-05-08 MED ORDER — POLYETHYLENE GLYCOL 3350 17 G PO PACK: 17.0000 g | PACK | Freq: Every day | ORAL | Status: DC

## 2024-05-08 NOTE — ED Triage Notes (Signed)
 BIB [facility] after being called out to a gas station for pt who was unresponsive. EMS arrived and completed CPR from 2001-2009. Reported that pt took a pill just PTA of EMS. Pt was given 1 mg Epi en route. 45 mm humoral hear IO and 20 g to RAC in place. 7.0 ETT 21 teeth  BP 140/58 HR 70s A-fib Spo2 92%

## 2024-05-08 NOTE — H&P (Signed)
 NAME:  Tony Calderon, MRN:  [id number], DOB:  11-13-1960, LOS: 0 ADMISSION DATE:  05/08/2024, CONSULTATION DATE:  05/08/24 REFERRING MD:  EDP, CHIEF COMPLAINT:  arrest   History of Present Illness:  63 year old man w/ hx of alcohol dependence, withdrawal seizures who was found unresponsive at a gas station after taking a pill?  Apneic asystolic on EMS arrival.  7 min CPR.  Comatose on arrival to ER.  EtOH up, +cocaine, benzo. CT head pending, PCCM asked to admit.  Pertinent  Medical History  Allergies Asthma HTN Polysubstance abuse (cocaine appears to be most common)  Significant Hospital Events: Including procedures, antibiotic start and stop dates in addition to other pertinent events   05/08/24  Interim History / Subjective:  Admit  Objective    Blood pressure (!) 161/94, pulse 100, resp. rate 17, height 5' 7 (1.702 m), weight 79.4 kg, SpO2 100%.    FiO2 (%):  [100 %] 100 %  No intake or output data in the 24 hours ending 05/08/24 2143 Filed Weights   05/08/24 2044 05/08/24 2045  Weight: 80 kg 79.4 kg    Examination: General: unresponsive on vent HENT: no corneals, pupils small + reactive sluggish Lungs: clear, passive on vent Cardiovascular: regular, ext warm, POCUS EF down a bit Abdomen: soft, +BS Extremities: no edema Neuro: GCS4t GU: foley minimal urine  Labs and imaging personally reviewed  Resolved problem list   Assessment and Plan   OHCA- in context of polysubstance abuse (cocaine alcohol benzo) Encephalopathy post arrest Need for mechanical ventilation LBBB chronic, abnormal echo (no prior)- will get formal, trops flat, no role for heparin gtt  - Sedation PRN, if wakes up see if follows commands - If follows commands not as much role for TTM - If does not wake up or wakes up w/o following commands, keep normothermic + sedated; arctic sun PRN - Usual multimodal neuroprognostication pathway - EEG, echo - Heparin DVT ppx, pepcid GI ppx - Empiric  thiamine - Vent bundle, ETT has been advanced and MV increased  Labs   CBC: Recent Labs  Lab 05/08/24 2055 05/08/24 2057  WBC  --  9.3  NEUTROABS  --  2.8  HGB 15.6 13.7  HCT 46.0 43.7  MCV  --  93.4  PLT  --  231    Basic Metabolic Panel: Recent Labs  Lab 05/08/24 2055 05/08/24 2057  NA 138 137  K 3.9 3.8  CL 101 101  CO2  --  21*  GLUCOSE 111* 113*  BUN 4* 5*  CREATININE 1.00 0.87  CALCIUM  --  8.1*  MG  --  2.1   GFR: Estimated Creatinine Clearance: 90 mL/min (by C-G formula based on SCr of 0.87 mg/dL). Recent Labs  Lab 05/08/24 2055 05/08/24 2057  WBC  --  9.3  LATICACIDVEN 4.8*  --     Liver Function Tests: Recent Labs  Lab 05/08/24 2057  AST 119*  ALT 43  ALKPHOS 69  BILITOT 0.5  PROT 7.4  ALBUMIN 3.2*   No results for input(s): LIPASE, AMYLASE in the last 168 hours. Recent Labs  Lab 05/08/24 2057  AMMONIA 54*    ABG    Component Value Date/Time   TCO2 22 05/08/2024 2055     Coagulation Profile: Recent Labs  Lab 05/08/24 2057  INR 1.0    Cardiac Enzymes: No results for input(s): CKTOTAL, CKMB, CKMBINDEX, TROPONINI in the last 168 hours.  HbA1C: No results found for: HGBA1C  CBG: Recent Labs  Lab 05/08/24 2047  GLUCAP 115*    Review of Systems:   Comatose  Past Medical History:  He,  has a past medical history of Allergy, Asthma, Hypertension, and Seizures (HCC).   Surgical History:   Past Surgical History:  Procedure Laterality Date   ANKLE ARTHROCENTESIS Right      Social History:   reports that he has never smoked. He has never used smokeless tobacco. He reports current alcohol use. He reports that he does not use drugs.   Family History:  His family history includes Cancer in his mother; Heart disease in his father; Hypertension in his father.   Allergies No Known Allergies   Home Medications  Prior to Admission medications   Medication Sig Start Date End Date Taking? Authorizing  Provider  amoxicillin -clavulanate (AUGMENTIN ) 875-125 MG tablet Take 1 tablet by mouth every 12 (twelve) hours. 12/12/23   Hildegard Loge, PA-C  budesonide -formoterol  (SYMBICORT ) 160-4.5 MCG/ACT inhaler Inhale 2 puffs into the lungs 2 (two) times daily. 06/08/23   Vivienne Delon HERO, PA-C  chlordiazePOXIDE  (LIBRIUM ) 25 MG capsule 50mg  PO TID x 1D, then 25-50mg  PO BID X 1D, then 25-50mg  PO QD X 1D 12/06/23   Towana Ozell BROCKS, MD  cyclobenzaprine  (FLEXERIL ) 5 MG tablet Take 1-2 tablets (5-10 mg total) by mouth 2 (two) times daily as needed for muscle spasms. 12/28/18   [name], [name], PA-C  diphenhydrAMINE (BENADRYL) 25 mg capsule Take 25 mg by mouth every 6 (six) hours as needed.     [provider]  hydrOXYzine  (ATARAX /VISTARIL ) 25 MG tablet Take 1 tablet (25 mg total) by mouth every 6 (six) hours as needed for anxiety. 08/09/19   [name], [name], PA-C  lisinopril  (ZESTRIL ) 10 MG tablet Take 1 tablet (10 mg total) by mouth daily. 10/25/19 12/24/19  Willo Dunnings, MD  loratadine (CLARITIN) 10 MG tablet Take 10 mg by mouth daily.    [provider]  Multiple Vitamin (MULTIVITAMIN) tablet Take 1 tablet by mouth daily.    [provider]  omeprazole  (PRILOSEC) 20 MG capsule Take 1 capsule (20 mg total) by mouth daily. 03/08/18 04/07/18  Viviann Pastor, MD  oxyCODONE  (ROXICODONE ) 5 MG immediate release tablet Take 1 tablet (5 mg total) by mouth every 4 (four) hours as needed for severe pain (pain score 7-10). 12/12/23   Hildegard, Amjad, PA-C  prednisoLONE  acetate (PRED FORTE ) 1 % ophthalmic suspension Place 1 drop into the right eye 4 (four) times daily. 12/13/18   Maranda Jamee Jacob, MD  promethazine -dextromethorphan  (PROMETHAZINE -DM) 6.25-15 MG/5ML syrup Take 5 mLs by mouth 4 (four) times daily as needed. 06/08/23   Vivienne Delon HERO, PA-C  sucralfate  (CARAFATE ) 1 GM/10ML suspension Take 10 mLs (1 g total) by mouth 4 (four) times daily. 10/25/19 10/24/20  Willo Dunnings, MD      Critical care time: 33 mins

## 2024-05-08 NOTE — Progress Notes (Incomplete)
 eLink Physician-Brief Progress Note Patient Name: DAMIANO STAMPER DOB: May 03, 1961 MRN: [id number]   Date of Service  05/08/2024  HPI/Events of Note  63 year old man w/ hx of alcohol dependence, withdrawal seizures who was found unresponsive at a gas station after taking a pill? Apneic asystolic on EMS arrival. 7 min CPR. Comatose on arrival to ER.  Vital signs are essentially normal saturating 100% on the ventilator with 50% FiO2.  Propofol infusion in place with intermittent fentanyl , off norepinephrine.  Results show mild hypercapnia and metabolic acidosis, downtrending hemoglobin.  UDS positive for benzos and cocaine.  eICU Interventions  Advance endotracheal tube,Increased respiratory rate, ABG in a.m.  Maintain mechanical ventilation, daily chest spontaneous awakening/breathing trials.    Neuromonitoring, monitoring for withdrawal.  Add lactulose lactulose  DVT prophylaxis with heparin GI prophylaxis with famotidine   0410 -add bilateral wrist restraints, given hypotension with propofol, initiate fentanyl  infusion for maximal analgesia    Intervention Category Evaluation Type: New Patient Evaluation  Jamelyn Bovard 05/08/2024, 11:54 PM

## 2024-05-08 NOTE — ED Provider Notes (Signed)
 [place] EMERGENCY DEPARTMENT AT [facility] Provider Note   CSN: [id number] Arrival date & time: 05/08/24  2036     Patient presents with: ROSC   Tony Calderon is a 63 y.o. male.   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  Pt was found unresponsive in his truck at a gas station.  EMS was told pt took some kind of pill then became unresponsive.  EMS reports that he was apneic and asystolic when they arrived shortly after the call.  They did CPR for 7 minutes.  He was given 1 epi and was orally intubated with a 7.0 ETT.  Pt is unable to give any hx.       Prior to Admission medications   Medication Sig Start Date End Date Taking? Authorizing Provider  amoxicillin -clavulanate (AUGMENTIN ) 875-125 MG tablet Take 1 tablet by mouth every 12 (twelve) hours. 12/12/23   Hildegard Loge, PA-C  budesonide -formoterol  (SYMBICORT ) 160-4.5 MCG/ACT inhaler Inhale 2 puffs into the lungs 2 (two) times daily. 06/08/23   Vivienne Delon HERO, PA-C  chlordiazePOXIDE  (LIBRIUM ) 25 MG capsule 50mg  PO TID x 1D, then 25-50mg  PO BID X 1D, then 25-50mg  PO QD X 1D 12/06/23   Towana Ozell BROCKS, MD  cyclobenzaprine  (FLEXERIL ) 5 MG tablet Take 1-2 tablets (5-10 mg total) by mouth 2 (two) times daily as needed for muscle spasms. 12/28/18   [name], [name], PA-C  diphenhydrAMINE (BENADRYL) 25 mg capsule Take 25 mg by mouth every 6 (six) hours as needed.     [provider]  hydrOXYzine  (ATARAX /VISTARIL ) 25 MG tablet Take 1 tablet (25 mg total) by mouth every 6 (six) hours as needed for anxiety. 08/09/19   [name], [name], PA-C  lisinopril  (ZESTRIL ) 10 MG tablet Take 1 tablet (10 mg total) by mouth daily. 10/25/19 12/24/19  Willo Dunnings, MD  loratadine (CLARITIN) 10 MG tablet Take 10 mg by mouth daily.    [provider]  Multiple Vitamin (MULTIVITAMIN) tablet Take 1 tablet by mouth daily.    [provider]  omeprazole   (PRILOSEC) 20 MG capsule Take 1 capsule (20 mg total) by mouth daily. 03/08/18 04/07/18  Viviann Pastor, MD  oxyCODONE  (ROXICODONE ) 5 MG immediate release tablet Take 1 tablet (5 mg total) by mouth every 4 (four) hours as needed for severe pain (pain score 7-10). 12/12/23   Hildegard, Amjad, PA-C  prednisoLONE  acetate (PRED FORTE ) 1 % ophthalmic suspension Place 1 drop into the right eye 4 (four) times daily. 12/13/18   Maranda Jamee Jacob, MD  promethazine -dextromethorphan  (PROMETHAZINE -DM) 6.25-15 MG/5ML syrup Take 5 mLs by mouth 4 (four) times daily as needed. 06/08/23   Vivienne Delon HERO, PA-C  sucralfate  (CARAFATE ) 1 GM/10ML suspension Take 10 mLs (1 g total) by mouth 4 (four) times daily. 10/25/19 10/24/20  Willo Dunnings, MD    Allergies: Patient has no known allergies.    Review of Systems  Unable to perform ROS: Patient unresponsive    Updated Vital Signs BP 96/71   Pulse 71   Temp (!) 97.1 F (36.2 C) (Bladder)   Resp 16   Ht 5' 7 (1.702 m)   Wt 79.4 kg   SpO2 100%   BMI 27.42 kg/m   Physical Exam Vitals and nursing note reviewed.  Constitutional:      General: He is in acute distress.     Interventions: He is intubated.  HENT:  Head:     Comments: Bruising to right side of face    Right Ear: External ear normal.     Left Ear: External ear normal.     Nose: Nose normal.     Mouth/Throat:     Comments: ETT in place Eyes:     Conjunctiva/sclera: Conjunctivae normal.     Pupils: Pupils are equal, round, and reactive to light.  Neck:     Vascular: No carotid bruit.  Cardiovascular:     Rate and Rhythm: Normal rate and regular rhythm.     Pulses: Normal pulses.     Heart sounds: Normal heart sounds.  Pulmonary:     Effort: He is intubated.     Comments: Bs with bagging Abdominal:     General: Abdomen is flat. Bowel sounds are normal.  Musculoskeletal:        General: Normal range of motion.  Skin:    General: Skin is warm.     Capillary Refill: Capillary refill  takes less than 2 seconds.  Neurological:     Mental Status: He is unresponsive.  Psychiatric:     Comments: Unable to assess     (all labs ordered are listed, but only abnormal results are displayed) Labs Reviewed  CBC WITH DIFFERENTIAL/PLATELET - Abnormal; Notable for the following components:      Result Value   RDW 16.4 (*)    Lymphs Abs 4.2 (*)    Monocytes Absolute 1.2 (*)    Eosinophils Absolute 0.6 (*)    Basophils Absolute 0.2 (*)    Abs Immature Granulocytes 0.37 (*)    All other components within normal limits  COMPREHENSIVE METABOLIC PANEL WITH GFR - Abnormal; Notable for the following components:   CO2 21 (*)    Glucose, Bld 113 (*)    BUN 5 (*)    Calcium 8.1 (*)    Albumin 3.2 (*)    AST 119 (*)    All other components within normal limits  ETHANOL - Abnormal; Notable for the following components:   Alcohol, Ethyl (B) 293 (*)    All other components within normal limits  RAPID URINE DRUG SCREEN, HOSP PERFORMED - Abnormal; Notable for the following components:   Cocaine POSITIVE (*)    Benzodiazepines POSITIVE (*)    All other components within normal limits  URINALYSIS, ROUTINE W REFLEX MICROSCOPIC - Abnormal; Notable for the following components:   APPearance HAZY (*)    Hgb urine dipstick SMALL (*)    Protein, ur 30 (*)    Bacteria, UA RARE (*)    All other components within normal limits  AMMONIA - Abnormal; Notable for the following components:   Ammonia 54 (*)    All other components within normal limits  CBG MONITORING, ED - Abnormal; Notable for the following components:   Glucose-Capillary 115 (*)    All other components within normal limits  I-STAT CG4 LACTIC ACID, ED - Abnormal; Notable for the following components:   Lactic Acid, Venous 4.8 (*)    All other components within normal limits  I-STAT CHEM 8, ED - Abnormal; Notable for the following components:   BUN 4 (*)    Glucose, Bld 111 (*)    Calcium, Ion 1.00 (*)    All other  components within normal limits  I-STAT ARTERIAL BLOOD GAS, ED - Abnormal; Notable for the following components:   pH, Arterial 7.260 (*)    pCO2 arterial 50.8 (*)    pO2, Arterial 539 (*)  Acid-base deficit 5.0 (*)    Calcium, Ion 1.07 (*)    HCT 37.0 (*)    Hemoglobin 12.6 (*)    All other components within normal limits  PROTIME-INR  MAGNESIUM   TRIGLYCERIDES  ACETAMINOPHEN  LEVEL  SALICYLATE LEVEL  HIV ANTIBODY (ROUTINE TESTING W REFLEX)  BLOOD GAS, ARTERIAL  CBC  BASIC METABOLIC PANEL WITH GFR  MAGNESIUM   PHOSPHORUS  HEPATIC FUNCTION PANEL  BLOOD GAS, ARTERIAL  TROPONIN I (HIGH SENSITIVITY)    EKG: EKG Interpretation Date/Time:  Sunday May 08 2024 20:44:29 EDT Ventricular Rate:  99 PR Interval:    QRS Duration:  172 QT Interval:  407 QTC Calculation: 523 R Axis:   31  Text Interpretation: Accelerated junctional rhythm IVCD, consider atypical LBBB Baseline wander in lead(s) V3 V4 V5 V6 No significant change since last tracing Confirmed by Dean Clarity [phone]) on 05/08/2024 8:48:28 PM  Radiology: CT Head Wo Contrast Result Date: 05/08/2024 CLINICAL DATA:  Mental status change, unknown cause EXAM: CT HEAD WITHOUT CONTRAST TECHNIQUE: Contiguous axial images were obtained from the base of the skull through the vertex without intravenous contrast. RADIATION DOSE REDUCTION: This exam was performed according to the departmental dose-optimization program which includes automated exposure control, adjustment of the mA and/or kV according to patient size and/or use of iterative reconstruction technique. COMPARISON:  Head CT 06/23/2023 FINDINGS: Brain: No intracranial hemorrhage, mass effect, or midline shift. Brain volume is normal for age. No hydrocephalus. The basilar cisterns are patent. No evidence of territorial infarct or acute ischemia. No extra-axial or intracranial fluid collection. Vascular: Atherosclerosis of skullbase vasculature without hyperdense vessel or  abnormal calcification. Skull: No fracture or focal lesion. Sinuses/Orbits: Intubation with orogastric tube placement. Mucosal thickening in the paranasal sinuses may be related to intubation. Other: Retained secretions in the pharynx. IMPRESSION: No acute intracranial abnormality. Electronically Signed   By: Andrea Gasman M.D.   On: 05/08/2024 22:38   DG Abd Portable 1 View Result Date: 05/08/2024 EXAM: 1 VIEW XRAY OF THE ABDOMEN 05/08/2024 09:33:00 PM COMPARISON: None available. CLINICAL HISTORY: OG tube insertion. Patient presented with altered mental status and unresponsiveness. History of pill ingestion. EMS performed CPR and administered epinephrine. FINDINGS: LINES, TUBES AND DEVICES: Enteric tube coursing below the diaphragm with distal tip terminating within the expected location of the gastric body. BOWEL: Nonobstructive bowel gas pattern. SOFT TISSUES: No opaque urinary calculi. BONES: No acute osseous abnormality. IMPRESSION: 1. Enteric tube appropriately positioned with tip in the gastric body. Electronically signed by: Franky Stanford MD 05/08/2024 09:58 PM EDT RP Workstation: [id number]   DG Chest Port 1 View Result Date: 05/08/2024 EXAM: 1 VIEW(S) XRAY OF THE CHEST 05/08/2024 09:33:00 PM COMPARISON: 12/06/2023 CLINICAL HISTORY: Altered mental status. Reason for exam- chest: AMS. Reason for exam- abdomen: OG tube insertion. Patient was unresponsive, received CPR, and reportedly took a pill prior to EMS arrival. FINDINGS: LINES, TUBES AND DEVICES: Endotracheal tube in place with tip 7 cm above the carina just above the level of the clavicular heads. Enteric tube in place with tip and side port positioned within the mid stomach. LUNGS AND PLEURA: No focal pulmonary opacity, pulmonary edema, pleural effusion, or pneumothorax. HEART AND MEDIASTINUM: Aortic arch atherosclerosis. No acute abnormality of the cardiac and mediastinal silhouettes. BONES AND SOFT TISSUES: No acute osseous abnormality.  IMPRESSION: 1. Endotracheal tube tip slightly high, approximately 7 cm above the carina; consider advancement for optimal positioning. 2. Enteric tube with tip and side port in the mid stomach, appropriate position. 3. No  acute cardiopulmonary process identified. 4. Aortic arch atherosclerosis. Electronically signed by: Franky Stanford MD 05/08/2024 09:57 PM EDT RP Workstation: [id number]     Procedures   Medications Ordered in the ED  propofol (DIPRIVAN) 1000 MG/100ML infusion (0 mcg/kg/min  79.4 kg Intravenous Hold 05/08/24 2127)  docusate (COLACE) 50 MG/5ML liquid 100 mg (has no administration in time range)  polyethylene glycol (MIRALAX  / GLYCOLAX ) packet 17 g (has no administration in time range)  ondansetron  (ZOFRAN ) injection 4 mg (has no administration in time range)  acetaminophen  (TYLENOL ) tablet 650 mg (has no administration in time range)    Or  acetaminophen  (TYLENOL ) 160 MG/5ML solution 650 mg (has no administration in time range)    Or  acetaminophen  (TYLENOL ) suppository 650 mg (has no administration in time range)  acetaminophen  (TYLENOL ) tablet 650 mg (has no administration in time range)    Or  acetaminophen  (TYLENOL ) 160 MG/5ML solution 650 mg (has no administration in time range)    Or  acetaminophen  (TYLENOL ) suppository 650 mg (has no administration in time range)  norepinephrine (LEVOPHED) 4mg  in (0.016 mg/mL) premix infusion (0 mcg/min Intravenous Hold 05/08/24 2229)  0.9 %  sodium chloride  infusion (has no administration in time range)  famotidine (PEPCID) tablet 20 mg (has no administration in time range)  fentaNYL  (SUBLIMAZE ) injection 50 mcg (has no administration in time range)  fentaNYL  (SUBLIMAZE ) injection 50-200 mcg (has no administration in time range)  Chlorhexidine Gluconate Cloth 2 % PADS 6 each (has no administration in time range)  heparin injection 5,000 Units (has no administration in time range)  sodium chloride  0.9 % bolus 1,000 mL (1,000 mLs  Intravenous New Bag/Given 05/08/24 2128)  sodium chloride  0.9 % bolus 1,000 mL (1,000 mLs Intravenous New Bag/Given 05/08/24 2129)                                    Medical Decision Making Amount and/or Complexity of Data Reviewed Labs: ordered. Radiology: ordered.  Risk Prescription drug management. Decision regarding hospitalization.   This patient presents to the ED for concern of unresponsive, this involves an extensive number of treatment options, and is a complaint that carries with it a high risk of complications and morbidity.  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  The pertinent results include:  cbc nl, istat chem 8 nl; lactic elevated at 4.8; inr nl; mg nl; trop 17; mg 2.1   Imaging Studies ordered:  I ordered imaging studies including cxr, abd xr, ct head  I independently visualized and interpreted imaging which showed  CXR/KUB:  Endotracheal tube tip slightly high, approximately 7 cm above the carina;  consider advancement for optimal positioning.  2. Enteric tube with tip and side port in the mid stomach, appropriate position.  3. No acute cardiopulmonary process identified.  4. Aortic arch atherosclerosis.  CT head: No acute intracranial abnormality.   I agree with the radiologist interpretation   Cardiac Monitoring:  The patient was maintained on a cardiac monitor.  I personally viewed and interpreted the cardiac monitored which showed an underlying rhythm of: junctional  Medicines ordered and prescription drug management:  I ordered medication including ivfs/propofol  for sx   Reevaluation of the patient after these medicines showed that the patient stayed the same I have reviewed the patients home medicines and have made adjustments as needed   Test Considered:  ct   Critical Interventions:  ventilator   Consultations Obtained:  I requested consultation with the intensivist (Dr. Claudene),  and discussed lab and imaging findings as well as pertinent plan - he will admit  Problem List / ED Course:  ROSC:  I suspect it's related to OD.  Pt not making any spontaneous movement currently.   Reevaluation:  After the interventions noted above, I reevaluated the patient and found that they have :improved   Social Determinants of Health:  Lives at home   Dispostion:  After consideration of the diagnostic results and the patients response to treatment, I feel that the patent would benefit from admission.  CRITICAL CARE Performed by: Mliss Boyers   Total critical care time: 30 minutes  Critical care time was exclusive of separately billable procedures and treating other patients.  Critical care was necessary to treat or prevent imminent or life-threatening deterioration.  Critical care was time spent personally by me on the following activities: development of treatment plan with patient and/or surrogate as well as nursing, discussions with consultants, evaluation of patient's response to treatment, examination of patient, obtaining history from patient or surrogate, ordering and performing treatments and interventions, ordering and review of laboratory studies, ordering and review of radiographic studies, pulse oximetry and re-evaluation of patient's condition.        Final diagnoses:  Polysubstance abuse (HCC)  Alcoholic intoxication without complication  Cardiac arrest [facility])    ED Discharge Orders     None          Boyers Mliss, MD 05/08/24 2247

## 2024-05-09 ENCOUNTER — Inpatient Hospital Stay (HOSPITAL_COMMUNITY): Payer: Self-pay

## 2024-05-09 ENCOUNTER — Other Ambulatory Visit: Payer: Self-pay

## 2024-05-09 DIAGNOSIS — R569 Unspecified convulsions: Secondary | ICD-10-CM

## 2024-05-09 DIAGNOSIS — I469 Cardiac arrest, cause unspecified: Secondary | ICD-10-CM

## 2024-05-09 LAB — TRIGLYCERIDES: Triglycerides: 46 mg/dL (ref <150)

## 2024-05-09 LAB — CBC
HCT: 34.5 % — ABNORMAL LOW (ref 39.0–52.0)
Hemoglobin: 11.5 g/dL — ABNORMAL LOW (ref 13.0–17.0)
MCH: 30.2 pg (ref 26.0–34.0)
MCHC: 33.3 g/dL (ref 30.0–36.0)
MCV: 90.6 fL (ref 80.0–100.0)
Platelets: 205 K/uL (ref 150–400)
RBC: 3.81 MIL/uL — ABNORMAL LOW (ref 4.22–5.81)
RDW: 16.2 % — ABNORMAL HIGH (ref 11.5–15.5)
WBC: 7.1 K/uL (ref 4.0–10.5)
nRBC: 0 % (ref 0.0–0.2)

## 2024-05-09 LAB — HEPATIC FUNCTION PANEL
ALT: 36 U/L (ref 0–44)
AST: 83 U/L — ABNORMAL HIGH (ref 15–41)
Albumin: 2.6 g/dL — ABNORMAL LOW (ref 3.5–5.0)
Alkaline Phosphatase: 53 U/L (ref 38–126)
Bilirubin, Direct: 0.1 mg/dL (ref 0.0–0.2)
Indirect Bilirubin: 0.3 mg/dL (ref 0.3–0.9)
Total Bilirubin: 0.4 mg/dL (ref 0.0–1.2)
Total Protein: 5.7 g/dL — ABNORMAL LOW (ref 6.5–8.1)

## 2024-05-09 LAB — ECHOCARDIOGRAM COMPLETE
AR max vel: 2.78 cm2
AV Area VTI: 3.16 cm2
AV Area mean vel: 2.73 cm2
AV Mean grad: 3 mmHg
AV Peak grad: 5 mmHg
Ao pk vel: 1.12 m/s
Area-P 1/2: 3.91 cm2
Height: 67 in
S' Lateral: 3.1 cm
Weight: 2455.04 [oz_av]

## 2024-05-09 LAB — BASIC METABOLIC PANEL WITH GFR
Anion gap: 11 (ref 5–15)
BUN: 6 mg/dL — ABNORMAL LOW (ref 8–23)
CO2: 19 mmol/L — ABNORMAL LOW (ref 22–32)
Calcium: 7.3 mg/dL — ABNORMAL LOW (ref 8.9–10.3)
Chloride: 106 mmol/L (ref 98–111)
Creatinine, Ser: 0.61 mg/dL (ref 0.61–1.24)
GFR, Estimated: >60 mL/min (ref >60)
Glucose, Bld: 85 mg/dL (ref 70–99)
Potassium: 3.7 mmol/L (ref 3.5–5.1)
Sodium: 136 mmol/L (ref 135–145)

## 2024-05-09 LAB — POCT I-STAT 7, (LYTES, BLD GAS, ICA,H+H)
Acid-base deficit: 1 mmol/L (ref 0.0–2.0)
Bicarbonate: 21.2 mmol/L (ref 20.0–28.0)
Calcium, Ion: 1.11 mmol/L — ABNORMAL LOW (ref 1.15–1.40)
HCT: 32 % — ABNORMAL LOW (ref 39.0–52.0)
Hemoglobin: 10.9 g/dL — ABNORMAL LOW (ref 13.0–17.0)
O2 Saturation: 100 %
Potassium: 4.3 mmol/L (ref 3.5–5.1)
Sodium: 136 mmol/L (ref 135–145)
TCO2: 22 mmol/L (ref 22–32)
pCO2 arterial: 28.6 mmHg — ABNORMAL LOW (ref 32–48)
pH, Arterial: 7.478 — ABNORMAL HIGH (ref 7.35–7.45)
pO2, Arterial: 222 mmHg — ABNORMAL HIGH (ref 83–108)

## 2024-05-09 LAB — LACTIC ACID, PLASMA: Lactic Acid, Venous: 1.6 mmol/L (ref 0.5–1.9)

## 2024-05-09 LAB — GLUCOSE, CAPILLARY
Glucose-Capillary: 108 mg/dL — ABNORMAL HIGH (ref 70–99)
Glucose-Capillary: 176 mg/dL — ABNORMAL HIGH (ref 70–99)
Glucose-Capillary: 189 mg/dL — ABNORMAL HIGH (ref 70–99)
Glucose-Capillary: 32 mg/dL — CL (ref 70–99)
Glucose-Capillary: 50 mg/dL — ABNORMAL LOW (ref 70–99)
Glucose-Capillary: 64 mg/dL — ABNORMAL LOW (ref 70–99)
Glucose-Capillary: 66 mg/dL — ABNORMAL LOW (ref 70–99)
Glucose-Capillary: 77 mg/dL (ref 70–99)
Glucose-Capillary: 84 mg/dL (ref 70–99)
Glucose-Capillary: 97 mg/dL (ref 70–99)

## 2024-05-09 LAB — MRSA NEXT GEN BY PCR, NASAL: MRSA by PCR Next Gen: NOT DETECTED

## 2024-05-09 LAB — MAGNESIUM: Magnesium: 1.7 mg/dL (ref 1.7–2.4)

## 2024-05-09 LAB — PHOSPHORUS: Phosphorus: 3.6 mg/dL (ref 2.5–4.6)

## 2024-05-09 LAB — TROPONIN I (HIGH SENSITIVITY)
Troponin I (High Sensitivity): 125 ng/L (ref <18)
Troponin I (High Sensitivity): 231 ng/L (ref <18)

## 2024-05-09 LAB — ACETAMINOPHEN LEVEL: Acetaminophen (Tylenol), Serum: <10 ug/mL — ABNORMAL LOW (ref 10–30)

## 2024-05-09 LAB — SALICYLATE LEVEL: Salicylate Lvl: <7 mg/dL — ABNORMAL LOW (ref 7.0–30.0)

## 2024-05-09 LAB — HIV ANTIBODY (ROUTINE TESTING W REFLEX): HIV Screen 4th Generation wRfx: NONREACTIVE

## 2024-05-09 MED ORDER — ACETAMINOPHEN 325 MG PO TABS
650.0000 mg | ORAL_TABLET | Freq: Four times a day (QID) | ORAL | Status: DC | PRN
  Administered 2024-05-09 – 2024-05-11 (×4): 650 mg via ORAL
  Filled 2024-05-09 (×5): qty 2

## 2024-05-09 MED ORDER — LORAZEPAM 2 MG/ML IJ SOLN: 1.0000 mg | INTRAMUSCULAR | Status: DC | PRN

## 2024-05-09 MED ORDER — DEXTROSE 50 % IV SOLN
INTRAVENOUS | Status: AC
  Filled 2024-05-09: qty 50

## 2024-05-09 MED ORDER — ACETAMINOPHEN 160 MG/5ML PO SOLN: 650.0000 mg | ORAL | Status: DC | PRN

## 2024-05-09 MED ORDER — HYDRALAZINE HCL 20 MG/ML IJ SOLN
10.0000 mg | Freq: Four times a day (QID) | INTRAMUSCULAR | Status: DC | PRN
  Administered 2024-05-09: 10 mg via INTRAVENOUS
  Filled 2024-05-09: qty 1

## 2024-05-09 MED ORDER — LOSARTAN POTASSIUM 25 MG PO TABS
25.0000 mg | ORAL_TABLET | Freq: Every day | ORAL | Status: DC
Start: 2024-05-09 — End: 2024-05-11
  Administered 2024-05-09 – 2024-05-10 (×2): 25 mg via ORAL
  Filled 2024-05-09 (×2): qty 1

## 2024-05-09 MED ORDER — ACETAMINOPHEN 650 MG RE SUPP: 650.0000 mg | RECTAL | Status: DC | PRN

## 2024-05-09 MED ORDER — DEXTROSE 50 % IV SOLN
25.0000 g | INTRAVENOUS | Status: AC
  Administered 2024-05-09: 25 g via INTRAVENOUS

## 2024-05-09 MED ORDER — SPIRONOLACTONE 25 MG PO TABS
25.0000 mg | ORAL_TABLET | Freq: Every day | ORAL | Status: DC
  Administered 2024-05-10 – 2024-05-12 (×3): 25 mg via ORAL
  Filled 2024-05-09 (×3): qty 1

## 2024-05-09 MED ORDER — DOCUSATE SODIUM 100 MG PO CAPS
100.0000 mg | ORAL_CAPSULE | Freq: Two times a day (BID) | ORAL | Status: DC
  Administered 2024-05-10 – 2024-05-12 (×5): 100 mg via ORAL
  Filled 2024-05-09 (×5): qty 1

## 2024-05-09 MED ORDER — ADULT MULTIVITAMIN W/MINERALS CH
1.0000 | ORAL_TABLET | Freq: Every day | ORAL | Status: DC
  Administered 2024-05-10 – 2024-05-12 (×3): 1 via ORAL
  Filled 2024-05-09 (×3): qty 1

## 2024-05-09 MED ORDER — PERFLUTREN LIPID MICROSPHERE
1.0000 mL | INTRAVENOUS | Status: DC | PRN
  Administered 2024-05-09: 2 mL via INTRAVENOUS

## 2024-05-09 MED ORDER — FENTANYL CITRATE PF 50 MCG/ML IJ SOSY
25.0000 ug | PREFILLED_SYRINGE | Freq: Once | INTRAMUSCULAR | Status: AC
  Administered 2024-05-09: 50 ug via INTRAVENOUS
  Filled 2024-05-09: qty 1

## 2024-05-09 MED ORDER — LORAZEPAM 1 MG PO TABS: 1.0000 mg | ORAL_TABLET | ORAL | Status: DC | PRN

## 2024-05-09 MED ORDER — POLYETHYLENE GLYCOL 3350 17 G PO PACK
17.0000 g | PACK | Freq: Every day | ORAL | Status: DC
  Administered 2024-05-10: 17 g via ORAL
  Filled 2024-05-09: qty 1

## 2024-05-09 MED ORDER — POTASSIUM CHLORIDE 20 MEQ PO PACK
40.0000 meq | PACK | Freq: Once | ORAL | Status: AC
  Administered 2024-05-09: 40 meq
  Filled 2024-05-09: qty 2

## 2024-05-09 MED ORDER — FENTANYL 2500MCG IN NS 250ML (10MCG/ML) PREMIX INFUSION
0.0000 ug/h | INTRAVENOUS | Status: DC
  Administered 2024-05-09: 50 ug/h via INTRAVENOUS
  Filled 2024-05-09: qty 250

## 2024-05-09 MED ORDER — DEXTROSE 50 % IV SOLN
25.0000 g | INTRAVENOUS | Status: AC
  Administered 2024-05-09: 25 g via INTRAVENOUS
  Filled 2024-05-09: qty 50

## 2024-05-09 MED ORDER — LACTULOSE 10 GM/15ML PO SOLN
20.0000 g | Freq: Three times a day (TID) | ORAL | Status: DC
  Administered 2024-05-09 (×2): 20 g
  Filled 2024-05-09 (×2): qty 30

## 2024-05-09 MED ORDER — ADULT MULTIVITAMIN W/MINERALS CH
1.0000 | ORAL_TABLET | Freq: Every day | ORAL | Status: DC
  Administered 2024-05-09: 1 via ORAL
  Filled 2024-05-09: qty 1

## 2024-05-09 MED ORDER — FENTANYL BOLUS VIA INFUSION
25.0000 ug | INTRAVENOUS | Status: DC | PRN
  Administered 2024-05-09: 100 ug via INTRAVENOUS
  Administered 2024-05-09 (×6): 50 ug via INTRAVENOUS

## 2024-05-09 MED ORDER — LACTULOSE 10 GM/15ML PO SOLN
20.0000 g | Freq: Three times a day (TID) | ORAL | Status: DC
  Administered 2024-05-10 – 2024-05-11 (×5): 20 g via ORAL
  Filled 2024-05-09 (×5): qty 30

## 2024-05-09 MED ORDER — FOLIC ACID 1 MG PO TABS
1.0000 mg | ORAL_TABLET | Freq: Every day | ORAL | Status: DC
  Administered 2024-05-09 – 2024-05-12 (×4): 1 mg via ORAL
  Filled 2024-05-09 (×4): qty 1

## 2024-05-09 MED ORDER — MAGNESIUM SULFATE 2 GM/50ML IV SOLN
2.0000 g | Freq: Once | INTRAVENOUS | Status: AC
  Administered 2024-05-09: 2 g via INTRAVENOUS
  Filled 2024-05-09: qty 50

## 2024-05-09 NOTE — Progress Notes (Signed)
 EEG complete - results pending

## 2024-05-09 NOTE — Progress Notes (Signed)
 Hypoglycemic Event  CBG: 50 @ 0727  Treatment: D50 50 mL (25 gm)  Symptoms: None  Follow-up CBG: Time:0757 CBG Result:189  Possible Reasons for Event: Unknown  Comments/MD notified: Dr. Gretta to be notified during morning rounds   Shannell Mikkelsen G Krystian Ferrentino, RN BSN

## 2024-05-09 NOTE — Progress Notes (Signed)
 Hypoglycemic Event  CBG: 31 @ 1106  Treatment: D50 50 mL (25 gm)  Symptoms: None  Follow-up CBG: Time:1132 CBG Result:176  Possible Reasons for Event: Unknown  Comments/MD notified:Dr. Gretta at bedside   Hart KANDICE Schlossman, RN BSN

## 2024-05-09 NOTE — Procedures (Signed)
 Extubation Procedure Note  Patient Details:   Name: Tony Calderon DOB: 10-31-1960 MRN: [id number]   Airway Documentation:    Vent end date: 05/09/24 Vent end time: 1153   Evaluation  O2 sats: stable throughout Complications: No apparent complications Patient did tolerate procedure well. Bilateral Breath Sounds: Diminished, Clear   Yes  Patient extubated per order to 4L [place] with no apparent complications. Positive cuff leak was noted prior to extubation. Patient is alert and oriented, has strong cough, and is able to speak. Vitals are stable. RT will continue to monitor.   Brady Plant CHRISTELLA Anon 05/09/2024, 12:01 PM

## 2024-05-09 NOTE — Progress Notes (Signed)
 Dr. Gretta notified of current VS, and patient reporting nausea and headache. Patient states that he drinks 6 beers a day. See new orders.

## 2024-05-09 NOTE — Progress Notes (Signed)
 Patient blood glucose was 66 at 1549. Pt requested ginger ale, drank 4 ounces and recheck blood glucose was 64 at 1614. Patient then drank 4 ounces of apple juice. Recheck blood glucose was 97 at 1627. Mentation and vitals stable.

## 2024-05-09 NOTE — Plan of Care (Signed)
  Problem: Skin Integrity: Goal: Risk for impaired skin integrity will be minimized. Outcome: Progressing   Problem: Education: Goal: Knowledge of General Education information will improve Description: Including pain rating scale, medication(s)/side effects and non-pharmacologic comfort measures Outcome: Progressing   Problem: Health Behavior/Discharge Planning: Goal: Ability to manage health-related needs will improve Outcome: Progressing   Problem: Clinical Measurements: Goal: Ability to maintain clinical measurements within normal limits will improve Outcome: Progressing Goal: Will remain free from infection Outcome: Progressing Goal: Diagnostic test results will improve Outcome: Progressing Goal: Respiratory complications will improve Outcome: Progressing Goal: Cardiovascular complication will be avoided Outcome: Progressing   Problem: Activity: Goal: Risk for activity intolerance will decrease Outcome: Progressing

## 2024-05-09 NOTE — Progress Notes (Signed)
 [facility] ADULT ICU REPLACEMENT PROTOCOL   The patient does apply for the [facility] Adult ICU Electrolyte Replacment Protocol based on the criteria listed below:   1.Exclusion criteria: TCTS, ECMO, Dialysis, and Myasthenia Gravis patients 2. Is GFR >/= 30 ml/min? Yes.    Patient's GFR today is >60 3. Is SCr </= 2? Yes.   Patient's SCr is 0.61 mg/dL 4. Did SCr increase >/= 0.5 in 24 hours? No. 5.Pt's weight >40kg  Yes.   6. Abnormal electrolyte(s): K, Mag  7. Electrolytes replaced per protocol 8.  Call MD STAT for K+ </= 2.5, Phos </= 1, or Mag </= 1 Physician:  Haze Hunter BRAVO Denny Lave 05/09/2024 5:46 AM;

## 2024-05-09 NOTE — Progress Notes (Addendum)
 NAME:  Tony Calderon, MRN:  [id number], DOB:  [birth year], LOS: 1 ADMISSION DATE:  05/08/2024, CONSULTATION DATE:  05/08/24 REFERRING MD:  EDP, CHIEF COMPLAINT:  arrest   History of Present Illness:  63 year old male with past medical history of hypertension, asthma, alcohol use disorder c/b withdrawal seizures who was found unresponsive in his truck at a gas station. EMS was called and reportedly told patient took a pill and became unresponsive. He was apneic and asystolic. Received 7 minutes CPR, x1 epi and intubated. Comatose when he arrived to the ER. Ethanol 293, ammonia 54, UDS+ cocaine, benzos. CCM asked to admit.   Pertinent  Medical History  Allergies Asthma HTN Polysubstance abuse (cocaine appears to be most common)  Significant Hospital Events: Including procedures, antibiotic start and stop dates in addition to other pertinent events   10/5: admit s/p OHCA  10/6: extubated   Interim History / Subjective:  NAEON. Extubated this morning with success.   Objective    Blood pressure (!) 94/58, pulse 76, temperature 100 F (37.8 C), resp. rate 15, height 5' 7 (1.702 m), weight 69.6 kg, SpO2 100%.    Vent Mode: PRVC FiO2 (%):  [40 %-100 %] 40 % Set Rate:  [16 bmp-20 bmp] 20 bmp Vt Set:  [500 mL-800 mL] 800 mL PEEP:  [5 cmH20] 5 cmH20 Plateau Pressure:  [13 cmH20-20 cmH20] 13 cmH20   Intake/Output Summary (Last 24 hours) at 05/09/2024 1108 Last data filed at 05/09/2024 1050 Gross per 24 hour  Intake 1952.25 ml  Output 1045 ml  Net 907.25 ml   Filed Weights   05/08/24 2045 05/08/24 2333 05/09/24 0306  Weight: 79.4 kg 69.6 kg 69.6 kg    Examination: General: awake, following commands on ventilator  HENT: perrla, ncat, anicteric sclera  Lungs: vented, clear bilaterally, strong cough  Cardiovascular: s1s2, no murmur, extremities warm  Abdomen: soft, +BS Extremities: no edema Neuro: awake, alert, following commands on vent  GU: foley minimal urine  CT head:  no acute findings Resolved problem list   Assessment and Plan   OHCA- in context of polysubstance abuse (cocaine alcohol benzo) Apneic and asystolic. CPR x7 minutes, 1 epi. UDS + benzos, cocaine, alcohol 290s.  - wakes up and follows commands so defer TTM  - on EEG, can be discontinued today  - f/u formal echocardiogran  - trend trop to peak 125>231   Acute hypoxic respiratory failure 2/2 above  - extubated 10/6 AM  - o2 for sat >92%  - IS and pulm toileting   Alcohol use disorder  History of alcohol withdrawal seizure  Ethanol 293.  - add in CIWA now  - lactulose TID  - seizure precautions  - thiamine, folic acid, mtvn   Polysubstance use disorder - cocaine  - counsel on cessation  - avoid pure beta blockade   LBBB chronic, abnormal echo (no prior)- will get formal echo, trops flat, no role for heparin gtt - f/u repeat echo as above   Labs   CBC: Recent Labs  Lab 05/08/24 2055 05/08/24 2057 05/08/24 2205 05/09/24 0222 05/09/24 0626  WBC  --  9.3  --  7.1  --   NEUTROABS  --  2.8  --   --   --   HGB 15.6 13.7 12.6* 11.5* 10.9*  HCT 46.0 43.7 37.0* 34.5* 32.0*  MCV  --  93.4  --  90.6  --   PLT  --  231  --  205  --  Basic Metabolic Panel: Recent Labs  Lab 05/08/24 2055 05/08/24 2057 05/08/24 2205 05/09/24 0222 05/09/24 0626  NA 138 137 138 136 136  K 3.9 3.8 3.7 3.7 4.3  CL 101 101  --  106  --   CO2  --  21*  --  19*  --   GLUCOSE 111* 113*  --  85  --   BUN 4* 5*  --  6*  --   CREATININE 1.00 0.87  --  0.61  --   CALCIUM  --  8.1*  --  7.3*  --   MG  --  2.1  --  1.7  --   PHOS  --   --   --  3.6  --    GFR: Estimated Creatinine Clearance: 90.7 mL/min (by C-G formula based on SCr of 0.61 mg/dL). Recent Labs  Lab 05/08/24 2055 05/08/24 2057 05/09/24 0222  WBC  --  9.3 7.1  LATICACIDVEN 4.8*  --  1.6    Liver Function Tests: Recent Labs  Lab 05/08/24 2057 05/09/24 0222  AST 119* 83*  ALT 43 36  ALKPHOS 69 53  BILITOT 0.5 0.4   PROT 7.4 5.7*  ALBUMIN 3.2* 2.6*   No results for input(s): LIPASE, AMYLASE in the last 168 hours. Recent Labs  Lab 05/08/24 2057  AMMONIA 54*    ABG    Component Value Date/Time   PHART 7.478 (H) 05/09/2024 0626   PCO2ART 28.6 (L) 05/09/2024 0626   PO2ART 222 (H) 05/09/2024 0626   HCO3 21.2 05/09/2024 0626   TCO2 22 05/09/2024 0626   ACIDBASEDEF 1.0 05/09/2024 0626   O2SAT 100 05/09/2024 0626     Coagulation Profile: Recent Labs  Lab 05/08/24 2057  INR 1.0    Cardiac Enzymes: No results for input(s): CKTOTAL, CKMB, CKMBINDEX, TROPONINI in the last 168 hours.  HbA1C: No results found for: HGBA1C  CBG: Recent Labs  Lab 05/08/24 2047 05/09/24 0328 05/09/24 0727 05/09/24 0757 05/09/24 1105  GLUCAP 115* 108* 50* 189* 32*    Review of Systems:   As above  Past Medical History:  He,  has a past medical history of Allergy, Asthma, Hypertension, and Seizures (HCC).   Surgical History:   Past Surgical History:  Procedure Laterality Date   ANKLE ARTHROCENTESIS Right      Social History:   reports that he has never smoked. He has never used smokeless tobacco. He reports current alcohol use. He reports that he does not use drugs.   Family History:  His family history includes Cancer in his mother; Heart disease in his father; Hypertension in his father.   Allergies No Known Allergies   Home Medications  Prior to Admission medications   Medication Sig Start Date End Date Taking? Authorizing Provider  amoxicillin -clavulanate (AUGMENTIN ) 875-125 MG tablet Take 1 tablet by mouth every 12 (twelve) hours. 12/12/23   Hildegard, Amjad, PA-C  budesonide -formoterol  (SYMBICORT ) 160-4.5 MCG/ACT inhaler Inhale 2 puffs into the lungs 2 (two) times daily. 06/08/23   Vivienne Delon HERO, PA-C  chlordiazePOXIDE  (LIBRIUM ) 25 MG capsule 50mg  PO TID x 1D, then 25-50mg  PO BID X 1D, then 25-50mg  PO QD X 1D 12/06/23   Towana Ozell BROCKS, MD  cyclobenzaprine  (FLEXERIL ) 5  MG tablet Take 1-2 tablets (5-10 mg total) by mouth 2 (two) times daily as needed for muscle spasms. 12/28/18   [name], [name], PA-C  diphenhydrAMINE (BENADRYL) 25 mg capsule Take 25 mg by mouth every 6 (six) hours as needed.  [provider]  hydrOXYzine  (ATARAX /VISTARIL ) 25 MG tablet Take 1 tablet (25 mg total) by mouth every 6 (six) hours as needed for anxiety. 08/09/19   [name], [name], PA-C  lisinopril  (ZESTRIL ) 10 MG tablet Take 1 tablet (10 mg total) by mouth daily. 10/25/19 12/24/19  Willo Dunnings, MD  loratadine (CLARITIN) 10 MG tablet Take 10 mg by mouth daily.    [provider]  Multiple Vitamin (MULTIVITAMIN) tablet Take 1 tablet by mouth daily.    [provider]  omeprazole  (PRILOSEC) 20 MG capsule Take 1 capsule (20 mg total) by mouth daily. 03/08/18 04/07/18  Viviann Pastor, MD  oxyCODONE  (ROXICODONE ) 5 MG immediate release tablet Take 1 tablet (5 mg total) by mouth every 4 (four) hours as needed for severe pain (pain score 7-10). 12/12/23   Hildegard Loge, PA-C  prednisoLONE  acetate (PRED FORTE ) 1 % ophthalmic suspension Place 1 drop into the right eye 4 (four) times daily. 12/13/18   Maranda Jamee Jacob, MD  promethazine -dextromethorphan  (PROMETHAZINE -DM) 6.25-15 MG/5ML syrup Take 5 mLs by mouth 4 (four) times daily as needed. 06/08/23   Vivienne Delon HERO, PA-C  sucralfate  (CARAFATE ) 1 GM/10ML suspension Take 10 mLs (1 g total) by mouth 4 (four) times daily. 10/25/19 10/24/20  Willo Dunnings, MD     Critical care time: 5    Tinnie FORBES Adolph DEVONNA [facility] 05/09/24 2:55 PM  Please see [email or web address] for pager details.  From 7A-7P if no response, please call [phone] After hours, please call ELink [phone]

## 2024-05-09 NOTE — Progress Notes (Signed)
 Transferred to 2H on vent by way of CAT scan without difficulties

## 2024-05-09 NOTE — Progress Notes (Signed)
 Dr. Gretta notified. See New orders.

## 2024-05-09 NOTE — Procedures (Signed)
 Patient Name: Tony Calderon  MRN: [id number]  Epilepsy Attending: Arlin MALVA Krebs  Referring Physician/Provider: Claudene Toribio BROCKS, MD  Date: 05/09/2024 Duration: 22.33 mins  Patient history: 63 year old man w/ hx of alcohol dependence, withdrawal seizures who was found unresponsive at a gas station after taking a pill? Apneic asystolic on EMS arrival. 7 min CPR. Comatose on arrival to ER. EtOH up, +cocaine, benzo. EG to evaluate for seizure  Level of alertness:  comatose  AEDs during EEG study: Propofol  Technical aspects: This EEG study was done with scalp electrodes positioned according to the 10-20 International system of electrode placement. Electrical activity was reviewed with band pass filter of 1-70Hz , sensitivity of 7 uV/mm, display speed of 27mm/sec with a 60Hz  notched filter applied as appropriate. EEG data were recorded continuously and digitally stored.  Video monitoring was available and reviewed as appropriate.  Description: EEG showed continuous generalized 3 to 6 Hz theta-delta slowing admixed with 13-15hz  beta activity distributed symmetrically and diffusely. Hyperventilation and photic stimulation were not performed.     ABNORMALITY - Continuous slow, generalized  IMPRESSION: This study is suggestive of severe diffuse encephalopathy, nonspecific etiology but likely related to sedation. No seizures or epileptiform discharges were seen throughout the recording.  Anissa Abbs O Renley Gutman

## 2024-05-10 DIAGNOSIS — J9601 Acute respiratory failure with hypoxia: Secondary | ICD-10-CM

## 2024-05-10 DIAGNOSIS — F1010 Alcohol abuse, uncomplicated: Secondary | ICD-10-CM

## 2024-05-10 DIAGNOSIS — R062 Wheezing: Secondary | ICD-10-CM

## 2024-05-10 LAB — HEPATIC FUNCTION PANEL
ALT: 33 U/L (ref 0–44)
AST: 66 U/L — ABNORMAL HIGH (ref 15–41)
Albumin: 2.8 g/dL — ABNORMAL LOW (ref 3.5–5.0)
Alkaline Phosphatase: 56 U/L (ref 38–126)
Bilirubin, Direct: 0.3 mg/dL — ABNORMAL HIGH (ref 0.0–0.2)
Indirect Bilirubin: 0.9 mg/dL (ref 0.3–0.9)
Total Bilirubin: 1.2 mg/dL (ref 0.0–1.2)
Total Protein: 6.4 g/dL — ABNORMAL LOW (ref 6.5–8.1)

## 2024-05-10 LAB — BASIC METABOLIC PANEL WITH GFR
Anion gap: 7 (ref 5–15)
BUN: <5 mg/dL — ABNORMAL LOW (ref 8–23)
CO2: 27 mmol/L (ref 22–32)
Calcium: 8.5 mg/dL — ABNORMAL LOW (ref 8.9–10.3)
Chloride: 100 mmol/L (ref 98–111)
Creatinine, Ser: 0.7 mg/dL (ref 0.61–1.24)
GFR, Estimated: >60 mL/min (ref >60)
Glucose, Bld: 76 mg/dL (ref 70–99)
Potassium: 4.5 mmol/L (ref 3.5–5.1)
Sodium: 134 mmol/L — ABNORMAL LOW (ref 135–145)

## 2024-05-10 LAB — CBC
HCT: 38.9 % — ABNORMAL LOW (ref 39.0–52.0)
Hemoglobin: 12.4 g/dL — ABNORMAL LOW (ref 13.0–17.0)
MCH: 29.1 pg (ref 26.0–34.0)
MCHC: 31.9 g/dL (ref 30.0–36.0)
MCV: 91.3 fL (ref 80.0–100.0)
Platelets: 195 K/uL (ref 150–400)
RBC: 4.26 MIL/uL (ref 4.22–5.81)
RDW: 16.2 % — ABNORMAL HIGH (ref 11.5–15.5)
WBC: 7.8 K/uL (ref 4.0–10.5)
nRBC: 0 % (ref 0.0–0.2)

## 2024-05-10 LAB — MAGNESIUM: Magnesium: 2 mg/dL (ref 1.7–2.4)

## 2024-05-10 LAB — PHOSPHORUS: Phosphorus: 3.4 mg/dL (ref 2.5–4.6)

## 2024-05-10 LAB — GLUCOSE, CAPILLARY
Glucose-Capillary: 93 mg/dL (ref 70–99)
Glucose-Capillary: 75 mg/dL (ref 70–99)
Glucose-Capillary: 103 mg/dL — ABNORMAL HIGH (ref 70–99)
Glucose-Capillary: 77 mg/dL (ref 70–99)
Glucose-Capillary: 76 mg/dL (ref 70–99)

## 2024-05-10 MED ORDER — ARFORMOTEROL TARTRATE 15 MCG/2ML IN NEBU
15.0000 ug | INHALATION_SOLUTION | Freq: Two times a day (BID) | RESPIRATORY_TRACT | Status: DC
  Administered 2024-05-10 – 2024-05-11 (×3): 15 ug via RESPIRATORY_TRACT
  Filled 2024-05-10 (×3): qty 2

## 2024-05-10 MED ORDER — ASPIRIN 81 MG PO CHEW
81.0000 mg | CHEWABLE_TABLET | ORAL | Status: AC
  Administered 2024-05-11: 81 mg via ORAL
  Filled 2024-05-10: qty 1

## 2024-05-10 MED ORDER — SODIUM CHLORIDE 0.9% FLUSH: 3.0000 mL | INTRAVENOUS | Status: DC | PRN

## 2024-05-10 MED ORDER — SODIUM CHLORIDE 0.9 % IV SOLN: 250.0000 mL | INTRAVENOUS | Status: AC | PRN

## 2024-05-10 MED ORDER — REVEFENACIN 175 MCG/3ML IN SOLN
175.0000 ug | Freq: Every day | RESPIRATORY_TRACT | Status: DC
  Administered 2024-05-10 – 2024-05-11 (×2): 175 ug via RESPIRATORY_TRACT
  Filled 2024-05-10 (×3): qty 3

## 2024-05-10 MED ORDER — SODIUM CHLORIDE 0.9% FLUSH
3.0000 mL | Freq: Two times a day (BID) | INTRAVENOUS | Status: DC
  Administered 2024-05-10 – 2024-05-11 (×3): 3 mL via INTRAVENOUS

## 2024-05-10 MED ORDER — ORAL CARE MOUTH RINSE: 15.0000 mL | OROMUCOSAL | Status: DC | PRN

## 2024-05-10 NOTE — Progress Notes (Signed)
 1510: Spoke with pt's sister, [name], via phone call. [name] provided patient password as noted in the chart. Provided update as to patient's current condition.

## 2024-05-10 NOTE — Progress Notes (Signed)
 1215: Pt arrived to unit via wheelchair, transfer from [facility]. Patient settled with personal items and call bell within reach.

## 2024-05-10 NOTE — Progress Notes (Signed)
 PT Cancellation Note  Patient Details Name: Tony Calderon MRN: [id number] DOB: 1961/01/14   Cancelled Treatment:    Reason Eval/Treat Not Completed: Other (comment)  Currently eating lunch then transferring off unit. Will follow-up this afternoon for PT evaluation.  Leontine Roads, PT, DPT [facility]  Rehabilitation Services Physical Therapist Office: [phone] Website: [email or web address]  Leontine GORMAN Roads 05/10/2024, 11:30 AM

## 2024-05-10 NOTE — TOC Initial Note (Signed)
 Transition of Care [facility]) - Initial/Assessment Note    Patient Details  Name: Tony Calderon MRN: [id number] Date of Birth: 1960-08-18  Transition of [facility] CM/SW Contact:    Arlana JINNY Nicholaus ISRAEL Phone Number: [phone] 05/10/2024, 3:17 PM  Clinical Narrative:   2:43 PM- HF CSW attempted to meet with patient at bedside. Patient was being seen PT. CSW will follow up with patient at a more appropriate time.   HF CSW/CM will continue to follow and monitor for dc readiness.                       Patient Goals and CMS Choice            Expected Discharge Plan and Services                                              Prior Living Arrangements/Services                       Activities of Daily Living   ADL Screening (condition at time of admission) Independently performs ADLs?: Yes (appropriate for developmental age) Is the patient deaf or have difficulty hearing?: No Does the patient have difficulty seeing, even when wearing glasses/contacts?: No Does the patient have difficulty concentrating, remembering, or making decisions?: No  Permission Sought/Granted                  Emotional Assessment              Admission diagnosis:  Cardiac arrest (HCC) [I46.9] Polysubstance abuse (HCC) [F19.10] Alcoholic intoxication without complication [F10.920] Patient Active Problem List   Diagnosis Date Noted   Alcohol abuse 05/10/2024   Wheezing 05/10/2024   Acute respiratory failure with hypoxia (HCC) 05/10/2024   Cardiac arrest (HCC) 05/08/2024   COPD exacerbation (HCC) 01/03/2017   PCP:  Freddrick, No Pharmacy:   [id number] - [place] - [address] [place]) [place] [address] Phone: [phone] Fax: [phone]  [id number] [place], [place] - [address] AT [facility] [address] [place] [address] Phone:  [phone] Fax: [phone]  [id number] - [place], [place] - [address] AT [facility] [address] [place] [address] Phone: [phone] Fax: [phone]  [facility] [address] [place] [address] Phone: [phone] Fax: [phone]     Social Drivers of Health (SDOH) Social History: SDOH Screenings   Food Insecurity: No Food Insecurity (05/09/2024)  Housing: Low Risk  (05/09/2024)  Transportation Needs: Unmet Transportation Needs (05/09/2024)  Utilities: Patient Declined (05/09/2024)  Social Connections: Moderately Integrated (05/09/2024)  Tobacco Use: Low Risk  (05/08/2024)   SDOH Interventions:     Readmission Risk Interventions     No data to display

## 2024-05-10 NOTE — Progress Notes (Signed)
 NAME:  Tony Calderon, MRN:  [id number], DOB:  [birth year], LOS: 2 ADMISSION DATE:  05/08/2024, CONSULTATION DATE:  05/08/24 REFERRING MD:  EDP, CHIEF COMPLAINT:  arrest   History of Present Illness:  63 year old male with past medical history of hypertension, asthma, alcohol use disorder c/b withdrawal seizures who was found unresponsive in his truck at a gas station. EMS was called and reportedly told patient took a pill and became unresponsive. He was apneic and asystolic. Received 7 minutes CPR, x1 epi and intubated. Comatose when he arrived to the ER. Ethanol 293, ammonia 54, UDS+ cocaine, benzos. CCM asked to admit.   Pertinent  Medical History  Allergies Asthma HTN Polysubstance abuse (cocaine appears to be most common)  Significant Hospital Events: Including procedures, antibiotic start and stop dates in addition to other pertinent events   10/5: admit s/p OHCA  10/6: extubated  10/7: transfer out; AHF consult for new diagnosis HFrEF, they will see; started on GDMT.   Interim History / Subjective:  NAEON. He denies any symptoms at current. No withdrawal symptoms. Somewhat evasive about his drug/alcohol use but readily discusses it when confronted. Echo yesterday with new HFrEF. AHF consulted and will see him. He was started on GDMT yesterday.   Objective    Blood pressure (!) 151/87, pulse 78, temperature 99.7 F (37.6 C), resp. rate 14, height 5' 7 (1.702 m), weight 69.5 kg, SpO2 100%.    Vent Mode: PSV;CPAP FiO2 (%):  [36 %-40 %] 36 % PEEP:  [5 cmH20] 5 cmH20 Pressure Support:  [5 cmH20] 5 cmH20   Intake/Output Summary (Last 24 hours) at 05/10/2024 0842 Last data filed at 05/10/2024 9187 Gross per 24 hour  Intake 512.27 ml  Output 2840 ml  Net -2327.73 ml   Filed Weights   05/08/24 2333 05/09/24 0306 05/10/24 0249  Weight: 69.6 kg 69.6 kg 69.5 kg    Examination: General: middle aged male, laying in bed, no acute distress  HENT: perrla, ncat, anicteric  sclera  Lungs: 2LNC, resp even and unlabored, clear  Cardiovascular: s1s2, no murmur, extremities warm  Abdomen: soft, +BS Extremities: no edema Neuro: awake, alert, following commands, non focal exam  GU: foley, light yellow urine   CT head: no acute findings Resolved problem list   Assessment and Plan   OHCA- in context of polysubstance abuse (cocaine alcohol benzo) Apneic and asystolic. CPR x7 minutes, 1 epi. UDS + benzos, cocaine, alcohol 290s.  - extubated and awake, following commands with no focal deficits  - EEG discontinued 10/6  - remove foley today  - ambulate   HFrEF - new diagnosis  LBBB chronic Echo on admission with EF 30-35%, no RWMA, G1DD  - AHF consult, will see, appreciate help in management  - may need cath  - started on GDMT yesterday with spironolactone and losartan  - tele monitoring  - appears euvolemic   Acute hypoxic respiratory failure 2/2 above  Extubated 10/6. On 2LNC. States he has a history of asthma, does not see pulm  - keep on yupelri, brovana for now  - prn duoneb  - wean O2 for sat >92%   Alcohol use disorder  History of alcohol withdrawal seizure  Ethanol 293 on admission. Denies withdrawal symptoms on AM 10/7.  - CIWA ordered if needed  - lactulose TID  - seizure precautions  - thiamine, folic acid, mtvn   Polysubstance use disorder - cocaine  - counsel on cessation  - avoid pure beta blockade  Transfer out of ICU today  Labs   CBC: Recent Labs  Lab 05/08/24 2057 05/08/24 2205 05/09/24 0222 05/09/24 0626 05/10/24 0449  WBC 9.3  --  7.1  --  7.8  NEUTROABS 2.8  --   --   --   --   HGB 13.7 12.6* 11.5* 10.9* 12.4*  HCT 43.7 37.0* 34.5* 32.0* 38.9*  MCV 93.4  --  90.6  --  91.3  PLT 231  --  205  --  195    Basic Metabolic Panel: Recent Labs  Lab 05/08/24 2055 05/08/24 2057 05/08/24 2205 05/09/24 0222 05/09/24 0626 05/10/24 0449  NA 138 137 138 136 136 134*  K 3.9 3.8 3.7 3.7 4.3 4.5  CL 101 101  --  106   --  100  CO2  --  21*  --  19*  --  27  GLUCOSE 111* 113*  --  85  --  76  BUN 4* 5*  --  6*  --  <5*  CREATININE 1.00 0.87  --  0.61  --  0.70  CALCIUM  --  8.1*  --  7.3*  --  8.5*  MG  --  2.1  --  1.7  --  2.0  PHOS  --   --   --  3.6  --  3.4   GFR: Estimated Creatinine Clearance: 90.7 mL/min (by C-G formula based on SCr of 0.7 mg/dL). Recent Labs  Lab 05/08/24 2055 05/08/24 2057 05/09/24 0222 05/10/24 0449  WBC  --  9.3 7.1 7.8  LATICACIDVEN 4.8*  --  1.6  --     Liver Function Tests: Recent Labs  Lab 05/08/24 2057 05/09/24 0222 05/10/24 0449  AST 119* 83* 66*  ALT 43 36 33  ALKPHOS 69 53 56  BILITOT 0.5 0.4 1.2  PROT 7.4 5.7* 6.4*  ALBUMIN 3.2* 2.6* 2.8*   No results for input(s): LIPASE, AMYLASE in the last 168 hours. Recent Labs  Lab 05/08/24 2057  AMMONIA 54*    ABG    Component Value Date/Time   PHART 7.478 (H) 05/09/2024 0626   PCO2ART 28.6 (L) 05/09/2024 0626   PO2ART 222 (H) 05/09/2024 0626   HCO3 21.2 05/09/2024 0626   TCO2 22 05/09/2024 0626   ACIDBASEDEF 1.0 05/09/2024 0626   O2SAT 100 05/09/2024 0626     Coagulation Profile: Recent Labs  Lab 05/08/24 2057  INR 1.0    Cardiac Enzymes: No results for input(s): CKTOTAL, CKMB, CKMBINDEX, TROPONINI in the last 168 hours.  HbA1C: No results found for: HGBA1C  CBG: Recent Labs  Lab 05/09/24 1627 05/09/24 2025 05/09/24 2358 05/10/24 0322 05/10/24 0719  GLUCAP 97 84 77 93 75    Review of Systems:   As above  Past Medical History:  He,  has a past medical history of Allergy, Asthma, Hypertension, and Seizures (HCC).   Surgical History:   Past Surgical History:  Procedure Laterality Date   ANKLE ARTHROCENTESIS Right      Social History:   reports that he has never smoked. He has never used smokeless tobacco. He reports current alcohol use. He reports that he does not use drugs.   Family History:  His family history includes Cancer in his mother; Heart  disease in his father; Hypertension in his father.   Allergies No Known Allergies   Home Medications  Prior to Admission medications   Medication Sig Start Date End Date Taking? Authorizing Provider  amoxicillin -clavulanate (AUGMENTIN ) 875-125 MG tablet  Take 1 tablet by mouth every 12 (twelve) hours. 12/12/23   Hildegard, Amjad, PA-C  budesonide -formoterol  (SYMBICORT ) 160-4.5 MCG/ACT inhaler Inhale 2 puffs into the lungs 2 (two) times daily. 06/08/23   Burnette, Jennifer M, PA-C  chlordiazePOXIDE  (LIBRIUM ) 25 MG capsule 50mg  PO TID x 1D, then 25-50mg  PO BID X 1D, then 25-50mg  PO QD X 1D 12/06/23   Towana Ozell BROCKS, MD  cyclobenzaprine  (FLEXERIL ) 5 MG tablet Take 1-2 tablets (5-10 mg total) by mouth 2 (two) times daily as needed for muscle spasms. 12/28/18   [name], [name], PA-C  diphenhydrAMINE (BENADRYL) 25 mg capsule Take 25 mg by mouth every 6 (six) hours as needed.     [provider]  hydrOXYzine  (ATARAX /VISTARIL ) 25 MG tablet Take 1 tablet (25 mg total) by mouth every 6 (six) hours as needed for anxiety. 08/09/19   [name], [name], PA-C  lisinopril  (ZESTRIL ) 10 MG tablet Take 1 tablet (10 mg total) by mouth daily. 10/25/19 12/24/19  Willo Dunnings, MD  loratadine (CLARITIN) 10 MG tablet Take 10 mg by mouth daily.    [provider]  Multiple Vitamin (MULTIVITAMIN) tablet Take 1 tablet by mouth daily.    [provider]  omeprazole  (PRILOSEC) 20 MG capsule Take 1 capsule (20 mg total) by mouth daily. 03/08/18 04/07/18  Viviann Pastor, MD  oxyCODONE  (ROXICODONE ) 5 MG immediate release tablet Take 1 tablet (5 mg total) by mouth every 4 (four) hours as needed for severe pain (pain score 7-10). 12/12/23   Hildegard, Amjad, PA-C  prednisoLONE  acetate (PRED FORTE ) 1 % ophthalmic suspension Place 1 drop into the right eye 4 (four) times daily. 12/13/18   Maranda Jamee Jacob, MD  promethazine -dextromethorphan  (PROMETHAZINE -DM) 6.25-15 MG/5ML syrup Take 5 mLs by mouth 4 (four) times daily as  needed. 06/08/23   Vivienne Delon HERO, PA-C  sucralfate  (CARAFATE ) 1 GM/10ML suspension Take 10 mLs (1 g total) by mouth 4 (four) times daily. 10/25/19 10/24/20  Willo Dunnings, MD     Critical care time: na    Tinnie FORBES Adolph DEVONNA [place] [facility] 05/10/24 8:42 AM  Please see [email or web address] for pager details.  From 7A-7P if no response, please call [phone] After hours, please call ELink [phone]

## 2024-05-10 NOTE — TOC Initial Note (Signed)
 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 States he does have a scale for daily weights. Pt screened by Financial counselor for Medicaid. Will assist with medication with MATCH/HF fund.  Educated patient on importance of cessation from ETOH and illicit drugs. Verbalized understanding. Will provide resources of programs in the area.   Will need arrange pt with PCP follow up.   Expected Discharge Plan: Home/Self Care Barriers to Discharge: Continued Medical Work up   Patient Goals and CMS Choice Patient states their goals for this hospitalization and ongoing recovery are:: wants to get better          Expected Discharge Plan and Services   Discharge Planning Services: CM Consult   Living arrangements for the past 2 months: Single Family Home                                      Prior Living Arrangements/Services Living arrangements for the past 2 months: Single Family Home Lives with:: Self Patient language and need for interpreter reviewed:: Yes Do you feel safe going back to the place where you live?: Yes      Need for Family Participation in Patient Care: No (Comment) Care giver support system in place?: No (comment) Current home services: DME (scale) Criminal Activity/Legal Involvement Pertinent to Current Situation/Hospitalization: No - Comment as needed  Activities of Daily Living   ADL Screening (condition at time of admission) Independently performs ADLs?: Yes (appropriate for developmental age) Is the patient deaf or have difficulty hearing?: No Does the patient have difficulty seeing, even when wearing glasses/contacts?: No Does the  patient have difficulty concentrating, remembering, or making decisions?: No  Permission Sought/Granted Permission sought to share information with : Case Manager, Family Supports, PCP Permission granted to share information with : Yes, Verbal Permission Granted  Share Information with NAME: Fern Asmar  Permission granted to share info w AGENCY: PCP, DME  Permission granted to share info w Relationship: sister  Permission granted to share info w Contact Information: [phone]  Emotional Assessment   Attitude/Demeanor/Rapport: Engaged Affect (typically observed): Accepting Orientation: : Oriented to Self, Oriented to Place, Oriented to  Time, Oriented to Situation   Psych Involvement: No (comment)  Admission diagnosis:  Cardiac arrest (HCC) [I46.9] Polysubstance abuse (HCC) [F19.10] Alcoholic intoxication without complication [F10.920] Patient Active Problem List   Diagnosis Date Noted   Alcohol abuse 05/10/2024   Wheezing 05/10/2024   Acute respiratory failure with hypoxia (HCC) 05/10/2024   Cardiac arrest (HCC) 05/08/2024   COPD exacerbation (HCC) 01/03/2017   PCP:  Freddrick, No Pharmacy:   [id number] - [place]), [place] - [address] [place] ([place]) [place] [address] Phone: [phone] Fax: [phone]  [id number] - [place], [place] - [address] AT [facility] OF [address] [place] [address] Phone: [phone] Fax: [phone]  [id number] - [place],  - [address] AT [address] [place] [address] Phone: [phone] Fax: [phone]  [facility] [address] [place] [address] Phone: [phone] Fax: [phone]     Social Drivers of Health (SDOH) Social History: SDOH Screenings   Food Insecurity: No Food Insecurity (05/09/2024)  Housing: Low  Risk  (05/09/2024)  Transportation Needs: Unmet Transportation Needs (05/09/2024)  Utilities: Patient Declined (05/09/2024)  Social Connections: Moderately Integrated (05/09/2024)  Tobacco Use: Low Risk  (05/08/2024)   SDOH Interventions:     Readmission Risk Interventions     No data to display

## 2024-05-10 NOTE — Evaluation (Signed)
 Physical Therapy Evaluation and Discharge Patient Details Name: Tony Calderon MRN: [id number] DOB: [birth year] Today's Date: 05/10/2024  History of Present Illness  63 y/o male admitted post OOH cardiac arrest 10/5. Was found unresponsive outside of a gas station. Apneic asystole on EMS arrival. Approx 7 min CPR prior to ROSC. Intubated in the field and comatose on arrival. PMH: asthma, polysubstance use and no known prior cardiac history.   Clinical Impression  Patient evaluated by Physical Therapy with no further acute PT needs identified. All education has been completed and the patient has no further questions. Feels close to baseline with some sternal pain he attributes to CPR. Pt ambulates at a mod I to fully independent level. Low fall risk based on DGI and BERG. No device or follow-up recommended. See below for any follow-up Physical Therapy or equipment needs. PT is signing off. Thank you for this referral.         If plan is discharge home, recommend the following: Assist for transportation   Can travel by private vehicle    yes    Equipment Recommendations None recommended by PT  Recommendations for Other Services       Functional Status Assessment Patient has not had a recent decline in their functional status     Precautions / Restrictions Precautions Precautions: Fall Recall of Precautions/Restrictions: Intact Restrictions Weight Bearing Restrictions Per Provider Order: No      Mobility  Bed Mobility Overal bed mobility: Independent                  Transfers Overall transfer level: Independent                      Ambulation/Gait Ambulation/Gait assistance: Modified independent (Device/Increase time) Gait Distance (Feet): 225 Feet Assistive device: None Gait Pattern/deviations: WFL(Within Functional Limits) Gait velocity: wfl Gait velocity interpretation: >2.62 ft/sec, indicative of community ambulatory   General Gait Details:  Grossly stable with minimal difficulty during DGI. No overt LOB. A little difficulty with sharp turns but able to self correct.  Stairs            Wheelchair Mobility     Tilt Bed    Modified Rankin (Stroke Patients Only)       Balance Overall balance assessment: Modified Independent                               Standardized Balance Assessment Standardized Balance Assessment : Berg Balance Test, Dynamic Gait Index Berg Balance Test Sit to Stand: Able to stand without using hands and stabilize independently Standing Unsupported: Able to stand safely 2 minutes Sitting with Back Unsupported but Feet Supported on Floor or Stool: Able to sit safely and securely 2 minutes Stand to Sit: Sits safely with minimal use of hands Transfers: Able to transfer safely, minor use of hands Standing Unsupported with Eyes Closed: Able to stand 10 seconds safely Standing Ubsupported with Feet Together: Able to place feet together independently and stand 1 minute safely From Standing, Reach Forward with Outstretched Arm: Can reach confidently >25 cm (10) From Standing Position, Pick up Object from Floor: Able to pick up shoe safely and easily From Standing Position, Turn to Look Behind Over each Shoulder: Looks behind from both sides and weight shifts well Turn 360 Degrees: Able to turn 360 degrees safely in 4 seconds or less Standing Unsupported, Alternately Place Feet on Step/Stool: Able to stand  independently and complete 8 steps >20 seconds Standing Unsupported, One Foot in Front: Able to plae foot ahead of the other independently and hold 30 seconds Standing on One Leg: Tries to lift leg/unable to hold 3 seconds but remains standing independently Total Score: 51 Dynamic Gait Index Level Surface: Normal Change in Gait Speed: Normal Gait with Horizontal Head Turns: Normal Gait with Vertical Head Turns: Normal Gait and Pivot Turn: Normal Step Over Obstacle: Normal Step  Around Obstacles: Mild Impairment Steps: Mild Impairment Total Score: 22       Pertinent Vitals/Pain Pain Assessment Pain Assessment: Faces Faces Pain Scale: Hurts little more Pain Location: sternumm Pain Descriptors / Indicators: Aching Pain Intervention(s): Limited activity within patient's tolerance, Monitored during session, Repositioned    Home Living Family/patient expects to be discharged to:: Private residence Living Arrangements: Spouse/significant other Available Help at Discharge: Family;Available PRN/intermittently Type of Home: House Home Access: Stairs to enter Entrance Stairs-Rails: Right;Left Entrance Stairs-Number of Steps: 2 Alternate Level Stairs-Number of Steps: 3-4 Home Layout: Two level (split) Home Equipment: None Additional Comments: Staying with a friend.    Prior Function Prior Level of Function : Independent/Modified Independent;Working/employed;Driving             Mobility Comments: ind ADLs Comments: ind works as Dealer Upper Extremity Assessment: Defer to OT evaluation    Lower Extremity Assessment Lower Extremity Assessment: Overall WFL for tasks assessed       Communication   Communication Communication: No apparent difficulties    Cognition Arousal: Alert Behavior During Therapy: WFL for tasks assessed/performed   PT - Cognitive impairments: No family/caregiver present to determine baseline, Safety/Judgement                         Following commands: Intact       Cueing Cueing Techniques: Verbal cues     General Comments General comments (skin integrity, edema, etc.): VSS, HR to 110s with exertion    Exercises     Assessment/Plan    PT Assessment Patient does not need any further PT services  PT Problem List         PT Treatment Interventions      PT Goals (Current goals can be found in the Care Plan section)  Acute Rehab PT  Goals Patient Stated Goal: go home PT Goal Formulation: All assessment and education complete, DC therapy    Frequency       Co-evaluation               AM-PAC PT 6 Clicks Mobility  Outcome Measure Help needed turning from your back to your side while in a flat bed without using bedrails?: None Help needed moving from lying on your back to sitting on the side of a flat bed without using bedrails?: None Help needed moving to and from a bed to a chair (including a wheelchair)?: None Help needed standing up from a chair using your arms (e.g., wheelchair or bedside chair)?: None Help needed to walk in hospital room?: None Help needed climbing 3-5 steps with a railing? : None 6 Click Score: 24    End of Session   Activity Tolerance: Patient tolerated treatment well Patient left: in bed;with call bell/phone within reach;with bed alarm set   PT Visit Diagnosis: Other abnormalities of gait and mobility (R26.89)    Time: 8567-8552 PT Time Calculation (min) (ACUTE ONLY): 15 min  Charges:   PT Evaluation $PT Eval Low Complexity: 1 Low   PT General Charges $$ ACUTE PT VISIT: 1 Visit         Leontine Roads, PT, DPT [facility]  Rehabilitation Services Physical Therapist Office: [phone] Website: [email or web address]   Leontine GORMAN Roads 05/10/2024, 2:59 PM

## 2024-05-10 NOTE — Consult Note (Cosign Needed Addendum)
 Advanced Heart Failure Team Consult Note   Primary Physician: Pcp, No Cardiologist:  New   Reason for Consultation: Systolic Heart Failure Post OOH Cardiac Arrest   HPI:    Tony Calderon is seen today for evaluation of systolic heart failure, post OOH cardiac arrest, at the request of Dr. Gretta, PCCM.   63 y/o male w/ h/o asthma, polysubstance use and no known prior cardiac history, admitted post OOH cardiac arrest 10/5. Was found unresponsive outside of a gas station. Apneic asystole on EMS arrival. Approx 7 min CPR prior to ROSC. Intubated in the field and comatose on arrival. Head CT no acute intracranial abnormality. ETOH elevated at 293. UDS + for cocaine and benzos. Post arrest EKG showed accelerated junctional rhythm, no ST changes, QTc 523 ms, K 3.8, Mg 2.1, Hs trop 17>>125>>231.  Echo EF 30-35%, GIDD, no RWMA, RV normal. HIV NR.   Extubated 10/6. No focal neurological deficits. HF GDMT has been initiated. HF team consulted to further assess.   SBPs in the 150s this morning. Rhythm stable.  SCr 0.70, K 4.5   Denies h/o ischemic CP. No dyspnea. Admits to drug use.   Paternal grandfather died from massive MI.     Echo 05/09/24 1. Left ventricular ejection fraction, by estimation, is 30 to 35%. The  left ventricle has moderately decreased function. The left ventricle has  no regional wall motion abnormalities. Left ventricular diastolic  parameters are consistent with Grade I  diastolic dysfunction (impaired relaxation).   2. Right ventricular systolic function is normal. The right ventricular  size is normal.   3. The mitral valve is normal in structure. No evidence of mitral valve  regurgitation. No evidence of mitral stenosis.   4. The aortic valve is normal in structure. Aortic valve regurgitation is  not visualized. No aortic stenosis is present.   5. The inferior vena cava is normal in size with greater than 50%  respiratory variability, suggesting right  atrial pressure of 3 mmHg.    Home Medications Prior to Admission medications   Medication Sig Start Date End Date Taking? Authorizing Provider  acetaminophen  (TYLENOL ) 500 MG tablet Take 500-1,000 mg by mouth every 6 (six) hours as needed for moderate pain (pain score 4-6).   Yes [provider]  albuterol  (VENTOLIN  HFA) 108 (90 Base) MCG/ACT inhaler Inhale 1-2 puffs into the lungs every 6 (six) hours as needed for wheezing or shortness of breath.   Yes [provider]  budesonide -formoterol  (SYMBICORT ) 160-4.5 MCG/ACT inhaler Inhale 2 puffs into the lungs 2 (two) times daily. Patient taking differently: Inhale 2 puffs into the lungs 2 (two) times daily as needed (Asthma). 06/08/23  Yes Vivienne Delon HERO, PA-C    Past Medical History: Past Medical History:  Diagnosis Date   Allergy    Asthma    Hypertension    Seizures (HCC)     Past Surgical History: Past Surgical History:  Procedure Laterality Date   ANKLE ARTHROCENTESIS Right     Family History: Family History  Problem Relation Age of Onset   Cancer Mother    Hypertension Father    Heart disease Father     Social History: Social History   Socioeconomic History   Marital status: Significant Other    Spouse name: Not on file   Number of children: Not on file   Years of education: Not on file   Highest education level: Not on file  Occupational History   Not on  file  Tobacco Use   Smoking status: Never   Smokeless tobacco: Never  Vaping Use   Vaping status: Never Used  Substance and Sexual Activity   Alcohol use: Yes   Drug use: No   Sexual activity: Not Currently  Other Topics Concern   Not on file  Social History Narrative   Not on file   Social Drivers of Health   Financial Resource Strain: Not on file  Food Insecurity: No Food Insecurity (05/09/2024)   Hunger Vital Sign    Worried About Running Out of Food in the Last Year: Never true    Ran Out of Food in the Last Year: Never  true  Transportation Needs: Unmet Transportation Needs (05/09/2024)   PRAPARE - Administrator, Civil Service (Medical): Yes    Lack of Transportation (Non-Medical): Yes  Physical Activity: Not on file  Stress: Not on file  Social Connections: Moderately Integrated (05/09/2024)   Social Connection and Isolation Panel    Frequency of Communication with Friends and Family: Never    Frequency of Social Gatherings with Friends and Family: Twice a week    Attends Religious Services: 1 to 4 times per year    Active Member of Clubs or Organizations: No    Attends Engineer, structural: 1 to 4 times per year    Marital Status: Married    Allergies:  No Known Allergies  Objective:    Vital Signs:   Temp:  [98.4 F (36.9 C)-100.9 F (38.3 C)] 99.7 F (37.6 C) (10/07 0700) Pulse Rate:  [65-101] 78 (10/07 0800) Resp:  [9-26] 14 (10/07 0700) BP: (83-182)/(54-117) 151/87 (10/07 0800) SpO2:  [92 %-100 %] 100 % (10/07 0630) FiO2 (%):  [36 %-40 %] 36 % (10/06 1201) Weight:  [69.5 kg] 69.5 kg (10/07 0249)    Weight change: Filed Weights   05/08/24 2333 05/09/24 0306 05/10/24 0249  Weight: 69.6 kg 69.6 kg 69.5 kg    Intake/Output:   Intake/Output Summary (Last 24 hours) at 05/10/2024 0859 Last data filed at 05/10/2024 0812 Gross per 24 hour  Intake 512.27 ml  Output 2840 ml  Net -2327.73 ml      Physical Exam    Vitals:   05/10/24 0800 05/10/24 0900  BP: (!) 151/87 133/75  Pulse: 78 61  Resp: (!) 24 11  Temp: 99.7 F (37.6 C) 99.5 F (37.5 C)  SpO2:  100%   GENERAL: fatigued appearing, NAD Lungs- clear  CARDIAC:  JVP 6 cm         Normal rate with regular rhythm. No MRG, no LEE  ABDOMEN: Soft, non-tender, non-distended.  EXTREMITIES: Warm and well perfused.  NEUROLOGIC: No obvious FND   Telemetry   NSR 60s, no ventricular arrhythmias, personally reviewed   EKG    Admit EKG accelerated junctional rhythm, 99 bpm, atypical LBBB, personally  reviewed   Labs   Basic Metabolic Panel: Recent Labs  Lab 05/08/24 2055 05/08/24 2057 05/08/24 2205 05/09/24 0222 05/09/24 0626 05/10/24 0449  NA 138 137 138 136 136 134*  K 3.9 3.8 3.7 3.7 4.3 4.5  CL 101 101  --  106  --  100  CO2  --  21*  --  19*  --  27  GLUCOSE 111* 113*  --  85  --  76  BUN 4* 5*  --  6*  --  <5*  CREATININE 1.00 0.87  --  0.61  --  0.70  CALCIUM  --  8.1*  --  7.3*  --  8.5*  MG  --  2.1  --  1.7  --  2.0  PHOS  --   --   --  3.6  --  3.4    Liver Function Tests: Recent Labs  Lab 05/08/24 2057 05/09/24 0222 05/10/24 0449  AST 119* 83* 66*  ALT 43 36 33  ALKPHOS 69 53 56  BILITOT 0.5 0.4 1.2  PROT 7.4 5.7* 6.4*  ALBUMIN 3.2* 2.6* 2.8*   No results for input(s): LIPASE, AMYLASE in the last 168 hours. Recent Labs  Lab 05/08/24 2057  AMMONIA 54*    CBC: Recent Labs  Lab 05/08/24 2057 05/08/24 2205 05/09/24 0222 05/09/24 0626 05/10/24 0449  WBC 9.3  --  7.1  --  7.8  NEUTROABS 2.8  --   --   --   --   HGB 13.7 12.6* 11.5* 10.9* 12.4*  HCT 43.7 37.0* 34.5* 32.0* 38.9*  MCV 93.4  --  90.6  --  91.3  PLT 231  --  205  --  195    Cardiac Enzymes: No results for input(s): CKTOTAL, CKMB, CKMBINDEX, TROPONINI in the last 168 hours.  BNP: BNP (last 3 results) Recent Labs    12/06/23 0857  BNP 181.4*    ProBNP (last 3 results) No results for input(s): PROBNP in the last 8760 hours.   CBG: Recent Labs  Lab 05/09/24 1627 05/09/24 2025 05/09/24 2358 05/10/24 0322 05/10/24 0719  GLUCAP 97 84 77 93 75    Coagulation Studies: Recent Labs    05/08/24 2057  LABPROT 13.5  INR 1.0     Imaging   ECHOCARDIOGRAM COMPLETE Result Date: 05/09/2024    ECHOCARDIOGRAM REPORT   Patient Name:   JUNIUS FAUCETT Calderon Date of Exam: 05/09/2024 Medical Rec #:  [id number]           Height:       67.0 in Accession #:    [id number]          Weight:       153.4 lb Date of Birth:  12/04/1960            BSA:          1.807 m  Patient Age:    63 years            BP:           91/63 mmHg Patient Gender: M                   HR:           66 bpm. Exam Location:  Inpatient Procedure: 2D Echo, Cardiac Doppler, Color Doppler and Intracardiac            Opacification Agent (Both Spectral and Color Flow Doppler were            utilized during procedure). Indications:    Cardiac arrest I46.9  History:        Patient has no prior history of Echocardiogram examinations.                 Risk Factors:Hypertension.  Sonographer:    Jayson Gaskins Referring Phys: [id number] TORIBIO BROCKS SMITH IMPRESSIONS  1. Left ventricular ejection fraction, by estimation, is 30 to 35%. The left ventricle has moderately decreased function. The left ventricle has no regional wall motion abnormalities. Left ventricular diastolic parameters are consistent with Grade I diastolic dysfunction (impaired relaxation).  2. Right  ventricular systolic function is normal. The right ventricular size is normal.  3. The mitral valve is normal in structure. No evidence of mitral valve regurgitation. No evidence of mitral stenosis.  4. The aortic valve is normal in structure. Aortic valve regurgitation is not visualized. No aortic stenosis is present.  5. The inferior vena cava is normal in size with greater than 50% respiratory variability, suggesting right atrial pressure of 3 mmHg. FINDINGS  Left Ventricle: Left ventricular ejection fraction, by estimation, is 30 to 35%. The left ventricle has moderately decreased function. The left ventricle has no regional wall motion abnormalities. Definity contrast agent was given IV to delineate the left ventricular endocardial borders. The left ventricular internal cavity size was normal in size. There is no left ventricular hypertrophy. Left ventricular diastolic parameters are consistent with Grade I diastolic dysfunction (impaired relaxation).  LV Wall Scoring: The mid and distal anterior septum is dyskinetic. The entire inferior wall, mid  inferoseptal segment, and apex are akinetic. Right Ventricle: The right ventricular size is normal. No increase in right ventricular wall thickness. Right ventricular systolic function is normal. Left Atrium: Left atrial size was normal in size. Right Atrium: Right atrial size was normal in size. Pericardium: There is no evidence of pericardial effusion. Mitral Valve: The mitral valve is normal in structure. No evidence of mitral valve regurgitation. No evidence of mitral valve stenosis. Tricuspid Valve: The tricuspid valve is normal in structure. Tricuspid valve regurgitation is trivial. No evidence of tricuspid stenosis. Aortic Valve: The aortic valve is normal in structure. Aortic valve regurgitation is not visualized. No aortic stenosis is present. Aortic valve mean gradient measures 3.0 mmHg. Aortic valve peak gradient measures 5.0 mmHg. Aortic valve area, by VTI measures 3.16 cm. Pulmonic Valve: The pulmonic valve was normal in structure. Pulmonic valve regurgitation is not visualized. No evidence of pulmonic stenosis. Aorta: The aortic root is normal in size and structure. Venous: The inferior vena cava is normal in size with greater than 50% respiratory variability, suggesting right atrial pressure of 3 mmHg. IAS/Shunts: No atrial level shunt detected by color flow Doppler.  LEFT VENTRICLE PLAX 2D LVIDd:         4.10 cm   Diastology LVIDs:         3.10 cm   LV e' medial:    6.42 cm/s LV PW:         1.50 cm   LV E/e' medial:  10.0 LV IVS:        0.90 cm   LV e' lateral:   6.85 cm/s LVOT diam:     1.90 cm   LV E/e' lateral: 9.4 LV SV:         58 LV SV Index:   32 LVOT Area:     2.84 cm  RIGHT VENTRICLE RV S prime:     10.90 cm/s TAPSE (M-mode): 1.7 cm LEFT ATRIUM             Index        RIGHT ATRIUM           Index LA Vol (A2C):   45.4 ml 25.13 ml/m  RA Area:     13.30 cm LA Vol (A4C):   22.9 ml 12.67 ml/m  RA Volume:   31.10 ml  17.21 ml/m LA Biplane Vol: 33.0 ml 18.26 ml/m  AORTIC VALVE AV Area  (Vmax):    2.78 cm AV Area (Vmean):   2.73 cm AV Area (VTI):  3.16 cm AV Vmax:           112.00 cm/s AV Vmean:          72.400 cm/s AV VTI:            0.185 m AV Peak Grad:      5.0 mmHg AV Mean Grad:      3.0 mmHg LVOT Vmax:         110.00 cm/s LVOT Vmean:        69.700 cm/s LVOT VTI:          0.206 m LVOT/AV VTI ratio: 1.11  AORTA Ao Root diam: 2.80 cm MITRAL VALVE MV Area (PHT): 3.91 cm    SHUNTS MV Decel Time: 194 msec    Systemic VTI:  0.21 m MV E velocity: 64.50 cm/s  Systemic Diam: 1.90 cm MV A velocity: 71.30 cm/s MV E/A ratio:  0.90 Oneil Parchment MD Electronically signed by Oneil Parchment MD Signature Date/Time: 05/09/2024/3:07:39 PM    Final    EEG adult Result Date: 05/09/2024 Shelton Arlin KIDD, MD     05/09/2024 11:26 AM Patient Name: Elsie FORBES Aran Calderon MRN: [id number] Epilepsy Attending: Arlin KIDD Shelton Referring Physician/Provider: Claudene Toribio BROCKS, MD Date: 05/09/2024 Duration: 22.33 mins Patient history: 63 year old man w/ hx of alcohol dependence, withdrawal seizures who was found unresponsive at a gas station after taking a pill? Apneic asystolic on EMS arrival. 7 min CPR. Comatose on arrival to ER. EtOH up, +cocaine, benzo. EG to evaluate for seizure Level of alertness:  comatose AEDs during EEG study: Propofol Technical aspects: This EEG study was done with scalp electrodes positioned according to the 10-20 International system of electrode placement. Electrical activity was reviewed with band pass filter of 1-70Hz , sensitivity of 7 uV/mm, display speed of 52mm/sec with a 60Hz  notched filter applied as appropriate. EEG data were recorded continuously and digitally stored.  Video monitoring was available and reviewed as appropriate. Description: EEG showed continuous generalized 3 to 6 Hz theta-delta slowing admixed with 13-15hz  beta activity distributed symmetrically and diffusely. Hyperventilation and photic stimulation were not performed.   ABNORMALITY - Continuous slow, generalized  IMPRESSION: This study is suggestive of severe diffuse encephalopathy, nonspecific etiology but likely related to sedation. No seizures or epileptiform discharges were seen throughout the recording. Priyanka O Yadav     Medications:     Current Medications:  arformoterol  15 mcg Nebulization BID   Chlorhexidine Gluconate Cloth  6 each Topical Daily   docusate sodium  100 mg Oral BID   folic acid  1 mg Oral Daily   heparin injection (subcutaneous)  5,000 Units Subcutaneous Q8H   lactulose  20 g Oral TID   losartan  25 mg Oral Daily   multivitamin with minerals  1 tablet Oral Daily   polyethylene glycol  17 g Oral Daily   revefenacin  175 mcg Nebulization Daily   spironolactone  25 mg Oral Daily   thiamine (VITAMIN B1) injection  100 mg Intravenous Q8H    Infusions:  thiamine (VITAMIN B1) injection 500 mg (05/10/24 0526)      Patient Profile   63 y/o male w/ h/o asthma, polysubstance use and no known prior cardiac history, admitted post OOH cardiac arrest in setting of ETOH intoxication/drug overdose. UDS + for cocaine and benzos.   Assessment/Plan   1. OOH Cardiac Arrest  - apneic asystole on EMS arrival, 7 min of CPR prior to ROSC  - ethanol level elevated @ 293,  UDS + cocaine and benzos - Post arrest EKG accelerated junctional rhythm, no ST changes, QTc 523 ms, K 3.8, Mg 2.1, Hs trop 17>>125>>231, not c/w ACS.  Echo EF 30-35%, GIDD, no RWMA, RV normal. No h/o ischemic symptoms - now extubated and neurologically intact. Rhythm stable on tele  - suspect arrest precipitated by drug use but will need LHC to r/o coronary disease - not ideal candidate for ICD given substance use history. Needs to refrain from further substance use  2. Systolic Heart Failure - Echo post arrest, EF 30-35%, no RWMA, RV normal  - unclear if underlying CM vs myocardial stunning post arrest  - plan LHC and repeat limited echo prior to d/c  - continue GDMT up titration   - increase Losartan to 50  mg. Uninsured, unable to afford Entresto  - continue spironolactone 25 mg daily  - euvolemic on exam, does not need loop diuretic currently   3. Polysubstance Use - needs to avoid, d/w patient   Stable for transfer out of CCU today from HF perspective.   Length of Stay: 2  Caffie Shed, PA-C  05/10/2024, 8:59 AM    Advanced Heart Failure Team Pager [phone] (M-F; 7a - 5p)  Please contact [facility] for night-coverage after hours (4p -7a ) and weekends on [email or web address]

## 2024-05-11 ENCOUNTER — Encounter (HOSPITAL_COMMUNITY)
Admission: EM | Disposition: A | Discharge status: Home / Self Care | Payer: Self-pay | Source: Home / Self Care | Attending: Critical Care Medicine

## 2024-05-11 ENCOUNTER — Other Ambulatory Visit (HOSPITAL_COMMUNITY): Payer: Self-pay

## 2024-05-11 DIAGNOSIS — I5020 Unspecified systolic (congestive) heart failure: Secondary | ICD-10-CM

## 2024-05-11 DIAGNOSIS — J9601 Acute respiratory failure with hypoxia: Principal | ICD-10-CM

## 2024-05-11 DIAGNOSIS — F1410 Cocaine abuse, uncomplicated: Secondary | ICD-10-CM

## 2024-05-11 DIAGNOSIS — I10 Essential (primary) hypertension: Secondary | ICD-10-CM

## 2024-05-11 DIAGNOSIS — I469 Cardiac arrest, cause unspecified: Secondary | ICD-10-CM

## 2024-05-11 DIAGNOSIS — J449 Chronic obstructive pulmonary disease, unspecified: Secondary | ICD-10-CM

## 2024-05-11 DIAGNOSIS — F1010 Alcohol abuse, uncomplicated: Secondary | ICD-10-CM

## 2024-05-11 HISTORY — PX: RIGHT/LEFT HEART CATH AND CORONARY ANGIOGRAPHY: CATH118266

## 2024-05-11 LAB — POCT I-STAT EG7
Acid-base deficit: 3 mmol/L — ABNORMAL HIGH (ref 0.0–2.0)
Bicarbonate: 21.7 mmol/L (ref 20.0–28.0)
Calcium, Ion: 0.94 mmol/L — ABNORMAL LOW (ref 1.15–1.40)
HCT: 35 % — ABNORMAL LOW (ref 39.0–52.0)
Hemoglobin: 11.9 g/dL — ABNORMAL LOW (ref 13.0–17.0)
O2 Saturation: 67 %
Potassium: 3.3 mmol/L — ABNORMAL LOW (ref 3.5–5.1)
Sodium: 137 mmol/L (ref 135–145)
TCO2: 23 mmol/L (ref 22–32)
pCO2, Ven: 36.8 mmHg — ABNORMAL LOW (ref 44–60)
pH, Ven: 7.379 (ref 7.25–7.43)
pO2, Ven: 35 mmHg (ref 32–45)

## 2024-05-11 LAB — GLUCOSE, CAPILLARY
Glucose-Capillary: 116 mg/dL — ABNORMAL HIGH (ref 70–99)
Glucose-Capillary: 75 mg/dL (ref 70–99)
Glucose-Capillary: 76 mg/dL (ref 70–99)
Glucose-Capillary: 86 mg/dL (ref 70–99)
Glucose-Capillary: 99 mg/dL (ref 70–99)

## 2024-05-11 LAB — HEPATIC FUNCTION PANEL
ALT: 26 U/L (ref 0–44)
AST: 41 U/L (ref 15–41)
Albumin: 2.7 g/dL — ABNORMAL LOW (ref 3.5–5.0)
Alkaline Phosphatase: 52 U/L (ref 38–126)
Bilirubin, Direct: 0.2 mg/dL (ref 0.0–0.2)
Indirect Bilirubin: 0.5 mg/dL (ref 0.3–0.9)
Total Bilirubin: 0.7 mg/dL (ref 0.0–1.2)
Total Protein: 6.2 g/dL — ABNORMAL LOW (ref 6.5–8.1)

## 2024-05-11 LAB — BASIC METABOLIC PANEL WITH GFR
Anion gap: 10 (ref 5–15)
BUN: <5 mg/dL — ABNORMAL LOW (ref 8–23)
CO2: 25 mmol/L (ref 22–32)
Calcium: 8.2 mg/dL — ABNORMAL LOW (ref 8.9–10.3)
Chloride: 98 mmol/L (ref 98–111)
Creatinine, Ser: 0.63 mg/dL (ref 0.61–1.24)
GFR, Estimated: >60 mL/min (ref >60)
Glucose, Bld: 99 mg/dL (ref 70–99)
Potassium: 3.7 mmol/L (ref 3.5–5.1)
Sodium: 133 mmol/L — ABNORMAL LOW (ref 135–145)

## 2024-05-11 LAB — CBC
HCT: 37.7 % — ABNORMAL LOW (ref 39.0–52.0)
Hemoglobin: 12.3 g/dL — ABNORMAL LOW (ref 13.0–17.0)
MCH: 29.4 pg (ref 26.0–34.0)
MCHC: 32.6 g/dL (ref 30.0–36.0)
MCV: 90 fL (ref 80.0–100.0)
Platelets: 218 K/uL (ref 150–400)
RBC: 4.19 MIL/uL — ABNORMAL LOW (ref 4.22–5.81)
RDW: 15.8 % — ABNORMAL HIGH (ref 11.5–15.5)
WBC: 7.8 K/uL (ref 4.0–10.5)
nRBC: 0 % (ref 0.0–0.2)

## 2024-05-11 LAB — PHOSPHORUS: Phosphorus: 3.1 mg/dL (ref 2.5–4.6)

## 2024-05-11 LAB — MAGNESIUM: Magnesium: 1.9 mg/dL (ref 1.7–2.4)

## 2024-05-11 SURGERY — RIGHT/LEFT HEART CATH AND CORONARY ANGIOGRAPHY
Anesthesia: LOCAL

## 2024-05-11 MED ORDER — SODIUM CHLORIDE 0.9% FLUSH
3.0000 mL | Freq: Two times a day (BID) | INTRAVENOUS | Status: DC
  Administered 2024-05-11 (×2): 3 mL via INTRAVENOUS

## 2024-05-11 MED ORDER — LABETALOL HCL 5 MG/ML IV SOLN: 10.0000 mg | INTRAVENOUS | Status: AC | PRN

## 2024-05-11 MED ORDER — HEPARIN (PORCINE) IN NACL 1000-0.9 UT/500ML-% IV SOLN
INTRAVENOUS | Status: DC | PRN
  Administered 2024-05-11: 1000 mL

## 2024-05-11 MED ORDER — ONDANSETRON HCL 4 MG/2ML IJ SOLN: 4.0000 mg | Freq: Four times a day (QID) | INTRAMUSCULAR | Status: DC | PRN

## 2024-05-11 MED ORDER — MAGNESIUM SULFATE 2 GM/50ML IV SOLN
2.0000 g | Freq: Once | INTRAVENOUS | Status: AC
  Administered 2024-05-11: 2 g via INTRAVENOUS
  Filled 2024-05-11: qty 50

## 2024-05-11 MED ORDER — HYDRALAZINE HCL 20 MG/ML IJ SOLN: 10.0000 mg | INTRAMUSCULAR | Status: AC | PRN

## 2024-05-11 MED ORDER — LIDOCAINE HCL (PF) 1 % IJ SOLN
INTRAMUSCULAR | Status: AC
Start: 2024-05-11 — End: 2024-05-11
  Filled 2024-05-11: qty 30

## 2024-05-11 MED ORDER — SODIUM CHLORIDE 0.9% FLUSH: 3.0000 mL | INTRAVENOUS | Status: DC | PRN

## 2024-05-11 MED ORDER — FENTANYL CITRATE (PF) 100 MCG/2ML IJ SOLN
INTRAMUSCULAR | Status: AC
  Filled 2024-05-11: qty 2

## 2024-05-11 MED ORDER — POTASSIUM CHLORIDE CRYS ER 20 MEQ PO TBCR
20.0000 meq | EXTENDED_RELEASE_TABLET | Freq: Once | ORAL | Status: AC
  Administered 2024-05-11: 20 meq via ORAL
  Filled 2024-05-11: qty 1

## 2024-05-11 MED ORDER — IOHEXOL 350 MG/ML SOLN
INTRAVENOUS | Status: DC | PRN
  Administered 2024-05-11: 75 mL

## 2024-05-11 MED ORDER — FLUTICASONE FUROATE-VILANTEROL 200-25 MCG/ACT IN AEPB
1.0000 | INHALATION_SPRAY | Freq: Every day | RESPIRATORY_TRACT | Status: DC
  Administered 2024-05-11: 1 via RESPIRATORY_TRACT
  Filled 2024-05-11: qty 28

## 2024-05-11 MED ORDER — VERAPAMIL HCL 2.5 MG/ML IV SOLN
INTRAVENOUS | Status: DC | PRN
  Administered 2024-05-11: 10 mL via INTRA_ARTERIAL

## 2024-05-11 MED ORDER — QUETIAPINE FUMARATE 100 MG PO TABS
100.0000 mg | ORAL_TABLET | Freq: Once | ORAL | Status: AC
Start: 2024-05-11 — End: 2024-05-11
  Administered 2024-05-11: 100 mg via ORAL
  Filled 2024-05-11 (×3): qty 1

## 2024-05-11 MED ORDER — VERAPAMIL HCL 2.5 MG/ML IV SOLN
INTRAVENOUS | Status: AC
  Filled 2024-05-11: qty 2

## 2024-05-11 MED ORDER — IPRATROPIUM-ALBUTEROL 0.5-2.5 (3) MG/3ML IN SOLN: 3.0000 mL | RESPIRATORY_TRACT | Status: DC | PRN

## 2024-05-11 MED ORDER — MIDAZOLAM HCL 2 MG/2ML IJ SOLN
INTRAMUSCULAR | Status: DC | PRN
  Administered 2024-05-11: 2 mg via INTRAVENOUS

## 2024-05-11 MED ORDER — SODIUM CHLORIDE 0.9 % IV SOLN: 250.0000 mL | INTRAVENOUS | Status: DC | PRN

## 2024-05-11 MED ORDER — LOSARTAN POTASSIUM 50 MG PO TABS
50.0000 mg | ORAL_TABLET | Freq: Every day | ORAL | Status: DC
  Administered 2024-05-11: 50 mg via ORAL
  Filled 2024-05-11: qty 1

## 2024-05-11 MED ORDER — HEPARIN SODIUM (PORCINE) 1000 UNIT/ML IJ SOLN
INTRAMUSCULAR | Status: DC | PRN
Start: 2024-05-11 — End: 2024-05-11
  Administered 2024-05-11: 3500 [IU] via INTRAVENOUS

## 2024-05-11 MED ORDER — ACETAMINOPHEN 325 MG PO TABS
650.0000 mg | ORAL_TABLET | ORAL | Status: DC | PRN
  Administered 2024-05-12: 650 mg via ORAL
  Filled 2024-05-11: qty 2

## 2024-05-11 MED ORDER — FENTANYL CITRATE (PF) 100 MCG/2ML IJ SOLN
INTRAMUSCULAR | Status: DC | PRN
  Administered 2024-05-11: 25 ug via INTRAVENOUS

## 2024-05-11 MED ORDER — HEPARIN SODIUM (PORCINE) 1000 UNIT/ML IJ SOLN
INTRAMUSCULAR | Status: AC
  Filled 2024-05-11: qty 10

## 2024-05-11 MED ORDER — LIDOCAINE HCL (PF) 1 % IJ SOLN
INTRAMUSCULAR | Status: DC | PRN
  Administered 2024-05-11 (×2): 2 mL

## 2024-05-11 MED ORDER — MIDAZOLAM HCL 2 MG/2ML IJ SOLN
INTRAMUSCULAR | Status: AC
  Filled 2024-05-11: qty 2

## 2024-05-11 SURGICAL SUPPLY — 12 items
CATH 5FR JL3.5 JR4 ANG PIG MP (CATHETERS) IMPLANT
CATH SWAN GANZ 7F STRAIGHT (CATHETERS) IMPLANT
COVER PRB 48X5XTLSCP FOLD TPE (BAG) IMPLANT
DEVICE RAD COMP TR BAND LRG (VASCULAR PRODUCTS) IMPLANT
GLIDESHEATH SLEND SS 6F .021 (SHEATH) IMPLANT
GLIDESHEATH SLENDER 7FR .021G (SHEATH) IMPLANT
GUIDEWIRE INQWIRE 1.5J.035X260 (WIRE) IMPLANT
KIT HEART LEFT (KITS) IMPLANT
KIT MICROPUNCTURE NIT STIFF (SHEATH) IMPLANT
PACK CARDIAC CATHETERIZATION (CUSTOM PROCEDURE TRAY) ×1 IMPLANT
TRANSDUCER W/STOPCOCK (MISCELLANEOUS) IMPLANT
TUBING ART PRESS 72 MALE/FEM (TUBING) IMPLANT

## 2024-05-11 NOTE — Progress Notes (Signed)
 PROGRESS NOTE    Tony Calderon  FMW:[id number] DOB: 03-27-1961 DOA: 05/08/2024 PCP: Tanda Bleacher, MD  Subjective: Pt seen and examined earlier this AM prior to his LHC/RHC. Feeling ok. Chest is sore from CPR. Pt has been using cocaine for years. Admits to using cocaine earlier in the day prior to his cardiac arrest.   Hospital Course: CC: unresponsive, CPR HPI: 63 year old man w/ hx of alcohol dependence, withdrawal seizures who was found unresponsive at a gas station after taking a pill? Apneic asystolic on EMS arrival. 7 min CPR. Comatose on arrival to ER. EtOH up, +cocaine, benzo. CT head pending, PCCM asked to admit.   Significant Events: Admitted 05/08/2024 to ICU by PCCM, s/p asystole. CPR with ROSC after 7 mins. Intubated by EMS 05-09-2024 extubated. Cardiology consulted due to echo showing LVEF 30%. Recommending LHC 05-10-2024 transferred to TRH care 05-11-2024 RHC/LHC showed 1. 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 Normal EF 55% 5. Well-compensated filling pressures  Admission Labs: WBC 9.3, HgB 13.7, plt 231 Na 137, K 3.8, CO2 of 21, BUN 5, Scr 0.87, glu 113 T prot 7.4, alb 3.2, AST 119, ALT 43, alk phos 69, t. Bili 0.5 ETOH 293 NH3 of 53 Urine drug screen +cocaine, benzo  Admission Imaging Studies: CXR Endotracheal tube tip slightly high, approximately 7 cm above the carina; consider advancement for optimal positioning. 2. Enteric tube with tip and side port in the mid stomach, appropriate position 3. No acute cardiopulmonary process identified. 4. Aortic arch atherosclerosis. Abd XR Enteric tube appropriately positioned with tip in the gastric body.  CT head No acute intracranial abnormality.   Significant Labs:   Significant Imaging Studies:   Antibiotic Therapy: Anti-infectives (From admission, onward)    None       Procedures: 05-08-2024 intubated by EMS. CPR with  ROSC at 7 mins 05-09-2024 extubated to 4 L/min [place] 05-11-2024 RHC/LHC   Consultants: PCCM cardiology    Assessment and Plan: * Cardiac arrest (HCC) 05/11/24 likely due to cocaine abuse. LHC/RHC today did not show any significant CAD. 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 Appears cardiology going to get a cardiac CT to better define his anomalous LAD. Pt does not appear to have suffered any serious neurologic consequences.   COPD (chronic obstructive pulmonary disease) (HCC) 05/11/24 restart his home inhalers. Prn duonebs.   Cocaine abuse (HCC) 05/11/24 pt advised to stop all illicit drug use.   Wheezing 05/11/24 long hx of tobacco abuse. Restart his home inhaler. Not wheezing anymore. Resolved.   Alcohol abuse 05/11/24 pt advised to stop all etoh and illicit drug use.   Acute respiratory failure with hypoxia (HCC)-resolved as of 05/11/2024 05/11/24 admitted on 05-08-2024 intubated. He was extubated to 4 L/min on 05-09-2024. He is now on RA. Resolved.    DVT prophylaxis: heparin injection 5,000 Units Start: 05/08/24 2245 SCDs Start: 05/08/24 2215    Code Status: Full Code Family Communication: no family at bedside Disposition Plan: return home Reason for continuing need for hospitalization: going to Lhc/RHC today.  Objective: Vitals:   05/11/24 1234 05/11/24 1254 05/11/24 1315 05/11/24 1330  BP: (!) 160/93 (!) 158/88 (!) 152/87 (!) 149/94  Pulse: 71 86 65   Resp: 18 19 20    Temp:  98.6 F (37 C)    TempSrc:  Oral    SpO2: 96% 96% 100%   Weight:  Height:        Intake/Output Summary (Last 24 hours) at 05/11/2024 1539 Last data filed at 05/11/2024 1530 Gross per 24 hour  Intake 530 ml  Output 400 ml  Net 130 ml   Filed Weights   05/08/24 2333 05/09/24 0306 05/10/24 0249  Weight: 69.6 kg 69.6 kg 69.5 kg    Examination:  Physical Exam Vitals and nursing note reviewed.  Constitutional:      General: He is not in acute  distress.    Appearance: He is not toxic-appearing.  HENT:     Head: Normocephalic.     Nose: Nose normal.  Eyes:     Comments: Bruising over right upper eyelid  Cardiovascular:     Rate and Rhythm: Normal rate and regular rhythm.  Pulmonary:     Effort: Pulmonary effort is normal.     Breath sounds: Normal breath sounds.  Abdominal:     General: Abdomen is flat. Bowel sounds are normal.     Palpations: Abdomen is soft.  Musculoskeletal:     Right lower leg: No edema.     Left lower leg: No edema.  Skin:    General: Skin is warm and dry.     Capillary Refill: Capillary refill takes less than 2 seconds.  Neurological:     General: No focal deficit present.     Mental Status: He is alert and oriented to person, place, and time.     Data Reviewed: I have personally reviewed following labs and imaging studies  CBC: Recent Labs  Lab 05/08/24 2057 05/08/24 2205 05/09/24 0222 05/09/24 0626 05/10/24 0449 05/11/24 0237  WBC 9.3  --  7.1  --  7.8 7.8  NEUTROABS 2.8  --   --   --   --   --   HGB 13.7 12.6* 11.5* 10.9* 12.4* 12.3*  HCT 43.7 37.0* 34.5* 32.0* 38.9* 37.7*  MCV 93.4  --  90.6  --  91.3 90.0  PLT 231  --  205  --  195 218   Basic Metabolic Panel: Recent Labs  Lab 05/08/24 2055 05/08/24 2057 05/08/24 2205 05/09/24 0222 05/09/24 0626 05/10/24 0449 05/11/24 0237  NA 138 137 138 136 136 134* 133*  K 3.9 3.8 3.7 3.7 4.3 4.5 3.7  CL 101 101  --  106  --  100 98  CO2  --  21*  --  19*  --  27 25  GLUCOSE 111* 113*  --  85  --  76 99  BUN 4* 5*  --  6*  --  <5* <5*  CREATININE 1.00 0.87  --  0.61  --  0.70 0.63  CALCIUM  --  8.1*  --  7.3*  --  8.5* 8.2*  MG  --  2.1  --  1.7  --  2.0 1.9  PHOS  --   --   --  3.6  --  3.4 3.1   GFR: Estimated Creatinine Clearance: 90.7 mL/min (by C-G formula based on SCr of 0.63 mg/dL). Liver Function Tests: Recent Labs  Lab 05/08/24 2057 05/09/24 0222 05/10/24 0449 05/11/24 0237  AST 119* 83* 66* 41  ALT 43 36 33  26  ALKPHOS 69 53 56 52  BILITOT 0.5 0.4 1.2 0.7  PROT 7.4 5.7* 6.4* 6.2*  ALBUMIN 3.2* 2.6* 2.8* 2.7*    Recent Labs  Lab 05/08/24 2057  AMMONIA 54*   Coagulation Profile: Recent Labs  Lab 05/08/24 2057  INR 1.0  BNP (last 3 results) Recent Labs    12/06/23 0857  BNP 181.4*   CBG: Recent Labs  Lab 05/10/24 2104 05/11/24 0005 05/11/24 0436 05/11/24 0714 05/11/24 1104  GLUCAP 76 86 76 75 99   Lipid Profile: Recent Labs    05/09/24 0222  TRIG 46   Sepsis Labs: Recent Labs  Lab 05/08/24 2055 05/09/24 0222  LATICACIDVEN 4.8* 1.6    Recent Results (from the past 240 hours)  MRSA Next Gen by PCR, Nasal     Status: None   Collection Time: 05/08/24 11:31 PM   Specimen: Nasal Mucosa; Nasal Swab  Result Value Ref Range Status   MRSA by PCR Next Gen NOT DETECTED NOT DETECTED Final    Comment: (NOTE) The GeneXpert MRSA Assay (FDA approved for NASAL specimens only), is one component of a comprehensive MRSA colonization surveillance program. It is not intended to diagnose MRSA infection nor to guide or monitor treatment for MRSA infections. Test performance is not FDA approved in patients less than 19 years old. Performed at [facility], [address], [place], [place] [address]      Radiology Studies: CARDIAC CATHETERIZATION Result Date: 05/11/2024   Prox RCA to Mid RCA lesion is 20% stenosed. Findings: Ao =105/68 (83) LV = 112/6 RA =  7 RV = 33/5 PA = 33/7 (20) PCW = 3 Fick cardiac output/index = 5.0/2.7 PVR = 3.2 Ao sat = 96% PA sat = 67% Assessment: 1. 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 Normal EF 55% 5. Well-compensated filling pressures Plan/Discussion: He has had LV recovery. Will get cardiac CT to better define course of anomalous LAD in setting of cardiac arrest. Though given asystolic arrest suspect may be more related to drug intoxication. Toribio Fuel,  MD 12:49 PM   Scheduled Meds:  docusate sodium  100 mg Oral BID   fluticasone  furoate-vilanterol  1 puff Inhalation Daily   folic acid  1 mg Oral Daily   heparin injection (subcutaneous)  5,000 Units Subcutaneous Q8H   losartan  50 mg Oral Daily   multivitamin with minerals  1 tablet Oral Daily   polyethylene glycol  17 g Oral Daily   revefenacin  175 mcg Nebulization Daily   sodium chloride  flush  3 mL Intravenous Q12H   spironolactone  25 mg Oral Daily   thiamine (VITAMIN B1) injection  100 mg Intravenous Q8H   Continuous Infusions:  sodium chloride        LOS: 3 days   Time spent: 60 minutes  Camellia Door, DO  [facility]  05/11/2024, 3:39 PM

## 2024-05-11 NOTE — Progress Notes (Signed)
 Patient was forgetful early in the morning but he is being confused and agitated, was trying to remove TR band and cardiac monitoring ; educated him and stayed with him and removed the TR band without any bleeding.   When this RN entered the room to check in, he was trying leave the hospital, disoriented and was saying  I need to find my truck, this RN informed the charge RN, he was trying to harm staffs and leave the hospital, security called.  Informed to the MD, order placed for seroquel 100, patient refused to take the pill, notified to the MD again. Will continue to monitor.

## 2024-05-11 NOTE — Assessment & Plan Note (Signed)
 05/11/24 pt advised to stop all illicit drug use.

## 2024-05-11 NOTE — Plan of Care (Signed)
  Problem: Education: Goal: Ability to manage disease process will improve Outcome: Progressing   Problem: Cardiac: Goal: Ability to achieve and maintain adequate cardiopulmonary perfusion will improve Outcome: Progressing   Problem: Neurologic: Goal: Promote progressive neurologic recovery Outcome: Progressing   Problem: Skin Integrity: Goal: Risk for impaired skin integrity will be minimized. Outcome: Progressing   Problem: Education: Goal: Knowledge of General Education information will improve Description: Including pain rating scale, medication(s)/side effects and non-pharmacologic comfort measures Outcome: Progressing   Problem: Education: Goal: Knowledge of General Education information will improve Description: Including pain rating scale, medication(s)/side effects and non-pharmacologic comfort measures Outcome: Progressing   Problem: Health Behavior/Discharge Planning: Goal: Ability to manage health-related needs will improve Outcome: Progressing   Problem: Clinical Measurements: Goal: Ability to maintain clinical measurements within normal limits will improve Outcome: Progressing Goal: Will remain free from infection Outcome: Progressing

## 2024-05-11 NOTE — Assessment & Plan Note (Addendum)
 05/11/24 long hx of tobacco abuse. Restart his home inhaler. Not wheezing anymore. Resolved.

## 2024-05-11 NOTE — Hospital Course (Addendum)
 CC: unresponsive, CPR HPI: 63 year old man w/ hx of alcohol dependence, withdrawal seizures who was found unresponsive at a gas station after taking a pill? Apneic asystolic on EMS arrival. 7 min CPR. Comatose on arrival to ER. EtOH up, +cocaine, benzo. CT head pending, PCCM asked to admit.   Significant Events: Admitted 05/08/2024 to ICU by PCCM, s/p asystole. CPR with ROSC after 7 mins. Intubated by EMS 05-09-2024 extubated. Cardiology consulted due to echo showing LVEF 30%. Recommending LHC 05-10-2024 transferred to TRH care 05-11-2024 RHC/LHC showed 1. 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 Normal EF 55% 5. Well-compensated filling pressures  Admission Labs: WBC 9.3, HgB 13.7, plt 231 Na 137, K 3.8, CO2 of 21, BUN 5, Scr 0.87, glu 113 T prot 7.4, alb 3.2, AST 119, ALT 43, alk phos 69, t. Bili 0.5 ETOH 293 NH3 of 53 Urine drug screen +cocaine, benzo  Admission Imaging Studies: CXR Endotracheal tube tip slightly high, approximately 7 cm above the carina; consider advancement for optimal positioning. 2. Enteric tube with tip and side port in the mid stomach, appropriate position 3. No acute cardiopulmonary process identified. 4. Aortic arch atherosclerosis. Abd XR Enteric tube appropriately positioned with tip in the gastric body.  CT head No acute intracranial abnormality.   Significant Labs:   Significant Imaging Studies: RHC/LHC Findings:   Ao =105/68 (83) LV = 112/6  RA =  7 RV = 33/5 PA = 33/7 (20) PCW = 3 Fick cardiac output/index = 5.0/2.7 PVR = 3.2 Ao sat = 96% PA sat = 67%     Assessment: 1. 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 Normal EF 55% 5. Well-compensated filling pressures  Antibiotic Therapy: Anti-infectives (From admission, onward)    None       Procedures: 05-08-2024 intubated  by EMS. CPR with ROSC at 7 mins 05-09-2024 extubated to 4 L/min [place] 05-11-2024 RHC/LHC   Consultants: [facility] cardiology

## 2024-05-11 NOTE — Progress Notes (Signed)
 Patient received from cath lab, V/S  stable, Right radial vascular site is C/D, TR band is on, all needs met, call bell in reach.   05/11/24 1254  Vitals  Temp 98.6 F (37 C)  Temp Source Oral  BP (!) 158/88  MAP (mmHg) 107  BP Location Left Arm  BP Method Automatic  Patient Position (if appropriate) Lying  Pulse Rate 86  Pulse Rate Source Monitor  Resp 19  Level of Consciousness  Level of Consciousness Alert  MEWS COLOR  MEWS Score Color Green  Oxygen Therapy  SpO2 96 %  O2 Device Room Air  MEWS Score  MEWS Temp 0  MEWS Systolic 0  MEWS Pulse 0  MEWS RR 0  MEWS LOC 0  MEWS Score 0

## 2024-05-11 NOTE — TOC Progression Note (Addendum)
 Transition of Care [facility]) - Progression Note    Patient Details  Name: Tony Calderon MRN: [id number] Date of Birth: 24-Sep-1960  Transition of Care [facility]) CM/SW Contact  Arlana JINNY Nicholaus [place] Phone Number: [phone] 05/11/2024, 11:48 AM  Clinical Narrative:  HF CSW met with patient at dc. Patient is agreeable to a hospital follow up PCP appt. To establish care but the copay needs to be affordable. HF CSW attempted to leave SA resources with patient, but he stated that he does not need the information at this time. CSW provided resources on the AVS. Patients stated that his sister is POA. Patient will need transportation support at dc.   Hospital follow up appointment scheduled for Wednesday, May 25, 2024 at 10:00 AM.   HF CSW/CM will continue to follow and monitor for dc readiness.      Expected Discharge Plan: Home/Self Care Barriers to Discharge: Continued Medical Work up               Expected Discharge Plan and Services   Discharge Planning Services: CM Consult   Living arrangements for the past 2 months: Single Family Home                                       Social Drivers of Health (SDOH) Interventions SDOH Screenings   Food Insecurity: No Food Insecurity (05/09/2024)  Housing: Low Risk  (05/09/2024)  Transportation Needs: Unmet Transportation Needs (05/09/2024)  Utilities: Patient Declined (05/09/2024)  Social Connections: Moderately Integrated (05/09/2024)  Tobacco Use: Low Risk  (05/08/2024)    Readmission Risk Interventions     No data to display

## 2024-05-11 NOTE — Subjective & Objective (Signed)
 Pt seen and examined earlier this AM prior to his LHC/RHC. Feeling ok. Chest is sore from CPR. Pt has been using cocaine for years. Admits to using cocaine earlier in the day prior to his cardiac arrest.

## 2024-05-11 NOTE — Interval H&P Note (Signed)
 History and Physical Interval Note:  05/11/2024 11:14 AM  Tony Calderon  has presented today for surgery, with the diagnosis of heart failure.  The various methods of treatment have been discussed with the patient and family. After consideration of risks, benefits and other options for treatment, the patient has consented to  Procedure(s): RIGHT/LEFT HEART CATH AND CORONARY ANGIOGRAPHY (N/A) as a surgical intervention.  The patient's history has been reviewed, patient examined, no change in status, stable for surgery.  I have reviewed the patient's chart and labs.  Questions were answered to the patient's satisfaction.     Anzal Bartnick

## 2024-05-11 NOTE — Assessment & Plan Note (Signed)
 05/11/24 pt advised to stop all etoh and illicit drug use.

## 2024-05-11 NOTE — Progress Notes (Addendum)
 Advanced Heart Failure Rounding Note  Cardiologist: None  Chief Complaint: Post Cardiac Arrest/ Systolic Heart Failure  Subjective:    No ventricular arrhythmias. W/ occasional runs of ST/SVT. Currently NSR.   BPs remain elevated 150s-160s systolic   K 3.7 Scr 0.63   Chest is sore from chest compressions but no ischemic symptoms. Denies dyspnea. Currently laying flat w/o orthopnea.    Objective:   Weight Range: 69.5 kg Body mass index is 24 kg/m.   Vital Signs:   Temp:  [98.2 F (36.8 C)-99.5 F (37.5 C)] 98.3 F (36.8 C) (10/08 0718) Pulse Rate:  [61-99] 64 (10/08 0718) Resp:  [11-20] 20 (10/08 0718) BP: (128-171)/(73-108) 161/89 (10/08 0817) SpO2:  [90 %-100 %] 95 % (10/08 0751) FiO2 (%):  [21 %] 21 % (10/07 0908) Last BM Date : 05/10/24  Weight change: Filed Weights   05/08/24 2333 05/09/24 0306 05/10/24 0249  Weight: 69.6 kg 69.6 kg 69.5 kg    Intake/Output:   Intake/Output Summary (Last 24 hours) at 05/11/2024 0826 Last data filed at 05/10/2024 1617 Gross per 24 hour  Intake 290 ml  Output 205 ml  Net 85 ml      Physical Exam    GENERAL: NAD Lungs- clear  CARDIAC:  JVP not elevated          Normal rate with regular rhythm. No murmur.  No edema  ABDOMEN: Soft, non-tender, non-distended.  EXTREMITIES: Warm and well perfused.  NEUROLOGIC: No obvious FND    Telemetry   NSR 70s. Brief runs of ST/SVT, personally reviewed   EKG    N/A   Labs    CBC Recent Labs    05/08/24 2057 05/08/24 2205 05/10/24 0449 05/11/24 0237  WBC 9.3   < > 7.8 7.8  NEUTROABS 2.8  --   --   --   HGB 13.7   < > 12.4* 12.3*  HCT 43.7   < > 38.9* 37.7*  MCV 93.4   < > 91.3 90.0  PLT 231   < > 195 218   < > = values in this interval not displayed.   Basic Metabolic Panel Recent Labs    89/92/74 0449 05/11/24 0237  NA 134* 133*  K 4.5 3.7  CL 100 98  CO2 27 25  GLUCOSE 76 99  BUN <5* <5*  CREATININE 0.70 0.63  CALCIUM 8.5* 8.2*  MG 2.0 1.9   PHOS 3.4 3.1   Liver Function Tests Recent Labs    05/10/24 0449 05/11/24 0237  AST 66* 41  ALT 33 26  ALKPHOS 56 52  BILITOT 1.2 0.7  PROT 6.4* 6.2*  ALBUMIN 2.8* 2.7*   No results for input(s): LIPASE, AMYLASE in the last 72 hours. Cardiac Enzymes No results for input(s): CKTOTAL, CKMB, CKMBINDEX, TROPONINI in the last 72 hours.  BNP: BNP (last 3 results) Recent Labs    12/06/23 0857  BNP 181.4*    ProBNP (last 3 results) No results for input(s): PROBNP in the last 8760 hours.   D-Dimer No results for input(s): DDIMER in the last 72 hours. Hemoglobin A1C No results for input(s): HGBA1C in the last 72 hours. Fasting Lipid Panel Recent Labs    05/09/24 0222  TRIG 46   Thyroid Function Tests No results for input(s): TSH, T4TOTAL, T3FREE, THYROIDAB in the last 72 hours.  Invalid input(s): FREET3  Other results:   Imaging    No results found.   Medications:     Scheduled Medications:  arformoterol  15 mcg Nebulization BID   Chlorhexidine Gluconate Cloth  6 each Topical Daily   docusate sodium  100 mg Oral BID   folic acid  1 mg Oral Daily   heparin injection (subcutaneous)  5,000 Units Subcutaneous Q8H   lactulose  20 g Oral TID   losartan  50 mg Oral Daily   multivitamin with minerals  1 tablet Oral Daily   polyethylene glycol  17 g Oral Daily   revefenacin  175 mcg Nebulization Daily   sodium chloride  flush  3 mL Intravenous Q12H   spironolactone  25 mg Oral Daily   thiamine (VITAMIN B1) injection  100 mg Intravenous Q8H    Infusions:  sodium chloride       PRN Medications: sodium chloride , acetaminophen  **OR** [DISCONTINUED] acetaminophen  (TYLENOL ) oral liquid 160 mg/5 mL **OR** acetaminophen , LORazepam , LORazepam  **OR** LORazepam , ondansetron  (ZOFRAN ) IV, mouth rinse, sodium chloride  flush    Patient Profile   63 y/o male w/ h/o asthma, polysubstance use and no known prior cardiac history, admitted post  OOH cardiac arrest in setting of ETOH intoxication/drug overdose. UDS + for cocaine and benzos.   Assessment/Plan   1. OOH Cardiac Arrest  - apneic asystole on EMS arrival, 7 min of CPR prior to ROSC  - ethanol level elevated @ 293, UDS + cocaine and benzos - Post arrest EKG accelerated junctional rhythm, no ST changes, QTc 523 ms, K 3.8, Mg 2.1, Hs trop 17>>125>>231, not c/w ACS.  Echo EF 30-35%, GIDD, no RWMA, RV normal. No h/o ischemic symptoms - now extubated and neurologically intact. Rhythm stable on tele  - suspect arrest precipitated by drug use but will need LHC to r/o coronary disease - not ideal candidate for ICD given substance use history. Needs to refrain from further substance use   2. Systolic Heart Failure - Echo post arrest, EF 30-35%, no RWMA, RV normal  - unclear if underlying CM vs myocardial stunning post arrest  - plan LHC today to r/o CAD. D/w cath w/ patient today and he is agreeable   - continue GDMT up titration   - increase Losartan to 50 mg. Uninsured, unable to afford Entresto  - continue spironolactone 25 mg daily  - euvolemic on exam, does not need loop diuretic currently  - plan SGLT2i at d/c    3. Polysubstance Use - needs to avoid, d/w patient  4. Hypertension - BP elevated 150s-160s systolic - push up titration of HF GDMT   Length of Stay: [address] [place], PA-C  05/11/2024, 8:26 AM  Advanced Heart Failure Team Pager [phone] (M-F; 7a - 5p)  Please contact [facility] for night-coverage after hours (5p -7a ) and weekends on [email or web address]  Patient seen and examined with the above-signed Advanced Practice Provider and/or Housestaff. I personally reviewed laboratory data, imaging studies and relevant notes. I independently examined the patient and formulated the important aspects of the plan. I have edited the note to reflect any of my changes or salient points. I have personally discussed the plan with the patient and/or family.  63 y/o  male with ETOH, HTN s/p cardiac arrest. EF 30-35%   Denies CP or SOB today. Pending R/L cath  General:  Well appearing. No resp difficulty HEENT: normal Neck: supple. no JVD. Carotids 2+ bilat; no bruits. No lymphadenopathy or thryomegaly appreciated. Cor: PMI nondisplaced. Regular rate & rhythm. No rubs, gallops or murmurs. Lungs: clear Abdomen: soft, nontender, nondistended. No hepatosplenomegaly. No bruits or masses. Good bowel sounds.  Extremities: no cyanosis, clubbing, rash, edema Neuro: alert & orientedx3, cranial nerves grossly intact. moves all 4 extremities w/o difficulty. Affect pleasant  Patient with OOH cardiac arrest. EF 30-35%.   Continue to titrate GDMT. R/L cath today. Consider cMRI.  ETOH cessation discussed  Ruth Kovich, MD  11:13 AM

## 2024-05-11 NOTE — Assessment & Plan Note (Signed)
 05/11/24 restart his home inhalers. Prn duonebs.

## 2024-05-11 NOTE — Assessment & Plan Note (Signed)
 05/11/24 likely due to cocaine abuse. LHC/RHC today did not show any significant CAD. 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 Appears cardiology going to get a cardiac CT to better define his anomalous LAD. Pt does not appear to have suffered any serious neurologic consequences.  05/12/24 cards discharged pt on losartan 50 mg daily. F/u with outpatient cards.

## 2024-05-11 NOTE — Progress Notes (Addendum)
 Patient verbalized that he had a pair of shoes and reading glasses during admission but had only one shoes at bedside, this RN couldn't find  other shoes and reading glasses , informed to the charge RN, will call ED if we can find.

## 2024-05-11 NOTE — Progress Notes (Signed)
 Mobility Specialist Progress Note:    05/11/24 1000  Mobility  Activity Ambulated independently  Level of Assistance Independent  Assistive Device None  Distance Ambulated (ft) 15 ft  Activity Response Tolerated fair  Mobility Referral Yes  Mobility visit 1 Mobility  Mobility Specialist Start Time (ACUTE ONLY) 1001  Mobility Specialist Stop Time (ACUTE ONLY) 1007  Mobility Specialist Time Calculation (min) (ACUTE ONLY) 6 min   Received pt ambulating in room prior to procedure. No c/o any symptoms. Pt moving and ambulating decently well. Somewhat nervous about procedure but otherwise doing well. Returned pt to room w/ all needs met.   Venetia Keel Mobility Specialist Please Neurosurgeon or Rehab Office at [phone]

## 2024-05-11 NOTE — Discharge Instructions (Signed)

## 2024-05-11 NOTE — Assessment & Plan Note (Signed)
 05/11/24 admitted on 05-08-2024 intubated. He was extubated to 4 L/min on 05-09-2024. He is now on RA. Resolved.

## 2024-05-11 NOTE — H&P (View-Only) (Signed)
 Advanced Heart Failure Rounding Note  Cardiologist: None  Chief Complaint: Post Cardiac Arrest/ Systolic Heart Failure  Subjective:    No ventricular arrhythmias. W/ occasional runs of ST/SVT. Currently NSR.   BPs remain elevated 150s-160s systolic   K 3.7 Scr 0.63   Chest is sore from chest compressions but no ischemic symptoms. Denies dyspnea. Currently laying flat w/o orthopnea.    Objective:   Weight Range: 69.5 kg Body mass index is 24 kg/m.   Vital Signs:   Temp:  [98.2 F (36.8 C)-99.5 F (37.5 C)] 98.3 F (36.8 C) (10/08 0718) Pulse Rate:  [61-99] 64 (10/08 0718) Resp:  [11-20] 20 (10/08 0718) BP: (128-171)/(73-108) 161/89 (10/08 0817) SpO2:  [90 %-100 %] 95 % (10/08 0751) FiO2 (%):  [21 %] 21 % (10/07 0908) Last BM Date : 05/10/24  Weight change: Filed Weights   05/08/24 2333 05/09/24 0306 05/10/24 0249  Weight: 69.6 kg 69.6 kg 69.5 kg    Intake/Output:   Intake/Output Summary (Last 24 hours) at 05/11/2024 0826 Last data filed at 05/10/2024 1617 Gross per 24 hour  Intake 290 ml  Output 205 ml  Net 85 ml      Physical Exam    GENERAL: NAD Lungs- clear  CARDIAC:  JVP not elevated          Normal rate with regular rhythm. No murmur.  No edema  ABDOMEN: Soft, non-tender, non-distended.  EXTREMITIES: Warm and well perfused.  NEUROLOGIC: No obvious FND    Telemetry   NSR 70s. Brief runs of ST/SVT, personally reviewed   EKG    N/A   Labs    CBC Recent Labs    05/08/24 2057 05/08/24 2205 05/10/24 0449 05/11/24 0237  WBC 9.3   < > 7.8 7.8  NEUTROABS 2.8  --   --   --   HGB 13.7   < > 12.4* 12.3*  HCT 43.7   < > 38.9* 37.7*  MCV 93.4   < > 91.3 90.0  PLT 231   < > 195 218   < > = values in this interval not displayed.   Basic Metabolic Panel Recent Labs    89/92/74 0449 05/11/24 0237  NA 134* 133*  K 4.5 3.7  CL 100 98  CO2 27 25  GLUCOSE 76 99  BUN <5* <5*  CREATININE 0.70 0.63  CALCIUM 8.5* 8.2*  MG 2.0 1.9   PHOS 3.4 3.1   Liver Function Tests Recent Labs    05/10/24 0449 05/11/24 0237  AST 66* 41  ALT 33 26  ALKPHOS 56 52  BILITOT 1.2 0.7  PROT 6.4* 6.2*  ALBUMIN 2.8* 2.7*   No results for input(s): LIPASE, AMYLASE in the last 72 hours. Cardiac Enzymes No results for input(s): CKTOTAL, CKMB, CKMBINDEX, TROPONINI in the last 72 hours.  BNP: BNP (last 3 results) Recent Labs    12/06/23 0857  BNP 181.4*    ProBNP (last 3 results) No results for input(s): PROBNP in the last 8760 hours.   D-Dimer No results for input(s): DDIMER in the last 72 hours. Hemoglobin A1C No results for input(s): HGBA1C in the last 72 hours. Fasting Lipid Panel Recent Labs    05/09/24 0222  TRIG 46   Thyroid Function Tests No results for input(s): TSH, T4TOTAL, T3FREE, THYROIDAB in the last 72 hours.  Invalid input(s): FREET3  Other results:   Imaging    No results found.   Medications:     Scheduled Medications:  arformoterol  15 mcg Nebulization BID   Chlorhexidine Gluconate Cloth  6 each Topical Daily   docusate sodium  100 mg Oral BID   folic acid  1 mg Oral Daily   heparin injection (subcutaneous)  5,000 Units Subcutaneous Q8H   lactulose  20 g Oral TID   losartan  50 mg Oral Daily   multivitamin with minerals  1 tablet Oral Daily   polyethylene glycol  17 g Oral Daily   revefenacin  175 mcg Nebulization Daily   sodium chloride  flush  3 mL Intravenous Q12H   spironolactone  25 mg Oral Daily   thiamine (VITAMIN B1) injection  100 mg Intravenous Q8H    Infusions:  sodium chloride       PRN Medications: sodium chloride , acetaminophen  **OR** [DISCONTINUED] acetaminophen  (TYLENOL ) oral liquid 160 mg/5 mL **OR** acetaminophen , LORazepam , LORazepam  **OR** LORazepam , ondansetron  (ZOFRAN ) IV, mouth rinse, sodium chloride  flush    Patient Profile   63 y/o male w/ h/o asthma, polysubstance use and no known prior cardiac history, admitted post  OOH cardiac arrest in setting of ETOH intoxication/drug overdose. UDS + for cocaine and benzos.   Assessment/Plan   1. OOH Cardiac Arrest  - apneic asystole on EMS arrival, 7 min of CPR prior to ROSC  - ethanol level elevated @ 293, UDS + cocaine and benzos - Post arrest EKG accelerated junctional rhythm, no ST changes, QTc 523 ms, K 3.8, Mg 2.1, Hs trop 17>>125>>231, not c/w ACS.  Echo EF 30-35%, GIDD, no RWMA, RV normal. No h/o ischemic symptoms - now extubated and neurologically intact. Rhythm stable on tele  - suspect arrest precipitated by drug use but will need LHC to r/o coronary disease - not ideal candidate for ICD given substance use history. Needs to refrain from further substance use   2. Systolic Heart Failure - Echo post arrest, EF 30-35%, no RWMA, RV normal  - unclear if underlying CM vs myocardial stunning post arrest  - plan LHC today to r/o CAD. D/w cath w/ patient today and he is agreeable   - continue GDMT up titration   - increase Losartan to 50 mg. Uninsured, unable to afford Entresto  - continue spironolactone 25 mg daily  - euvolemic on exam, does not need loop diuretic currently  - plan SGLT2i at d/c    3. Polysubstance Use - needs to avoid, d/w patient  4. Hypertension - BP elevated 150s-160s systolic - push up titration of HF GDMT   Length of Stay: [address] [place], PA-C  05/11/2024, 8:26 AM  Advanced Heart Failure Team Pager [phone] (M-F; 7a - 5p)  Please contact [facility] for night-coverage after hours (5p -7a ) and weekends on [email or web address]  Patient seen and examined with the above-signed Advanced Practice Provider and/or Housestaff. I personally reviewed laboratory data, imaging studies and relevant notes. I independently examined the patient and formulated the important aspects of the plan. I have edited the note to reflect any of my changes or salient points. I have personally discussed the plan with the patient and/or family.  63 y/o  male with ETOH, HTN s/p cardiac arrest. EF 30-35%   Denies CP or SOB today. Pending R/L cath  General:  Well appearing. No resp difficulty HEENT: normal Neck: supple. no JVD. Carotids 2+ bilat; no bruits. No lymphadenopathy or thryomegaly appreciated. Cor: PMI nondisplaced. Regular rate & rhythm. No rubs, gallops or murmurs. Lungs: clear Abdomen: soft, nontender, nondistended. No hepatosplenomegaly. No bruits or masses. Good bowel sounds.  Extremities: no cyanosis, clubbing, rash, edema Neuro: alert & orientedx3, cranial nerves grossly intact. moves all 4 extremities w/o difficulty. Affect pleasant  Patient with OOH cardiac arrest. EF 30-35%.   Continue to titrate GDMT. R/L cath today. Consider cMRI.  ETOH cessation discussed  Ivann Trimarco, MD  11:13 AM

## 2024-05-11 NOTE — Progress Notes (Signed)
 TR band removed at 1500, transparent dressing applied, educated the pt to keep the dressing on for 24 hours, dressing C/D/I.

## 2024-05-12 ENCOUNTER — Other Ambulatory Visit (HOSPITAL_COMMUNITY): Payer: Self-pay

## 2024-05-12 ENCOUNTER — Encounter (HOSPITAL_COMMUNITY): Payer: Self-pay | Admitting: Internal Medicine

## 2024-05-12 LAB — BASIC METABOLIC PANEL WITH GFR
Anion gap: 12 (ref 5–15)
BUN: <5 mg/dL — ABNORMAL LOW (ref 8–23)
CO2: 22 mmol/L (ref 22–32)
Calcium: 8.7 mg/dL — ABNORMAL LOW (ref 8.9–10.3)
Chloride: 101 mmol/L (ref 98–111)
Creatinine, Ser: 0.77 mg/dL (ref 0.61–1.24)
GFR, Estimated: >60 mL/min (ref >60)
Glucose, Bld: 103 mg/dL — ABNORMAL HIGH (ref 70–99)
Potassium: 3.8 mmol/L (ref 3.5–5.1)
Sodium: 135 mmol/L (ref 135–145)

## 2024-05-12 LAB — CBC
HCT: 39.6 % (ref 39.0–52.0)
Hemoglobin: 12.8 g/dL — ABNORMAL LOW (ref 13.0–17.0)
MCH: 29.3 pg (ref 26.0–34.0)
MCHC: 32.3 g/dL (ref 30.0–36.0)
MCV: 90.6 fL (ref 80.0–100.0)
Platelets: 230 K/uL (ref 150–400)
RBC: 4.37 MIL/uL (ref 4.22–5.81)
RDW: 15.9 % — ABNORMAL HIGH (ref 11.5–15.5)
WBC: 7.3 K/uL (ref 4.0–10.5)
nRBC: 0 % (ref 0.0–0.2)

## 2024-05-12 LAB — HEPATIC FUNCTION PANEL
ALT: 24 U/L (ref 0–44)
AST: 36 U/L (ref 15–41)
Albumin: 2.9 g/dL — ABNORMAL LOW (ref 3.5–5.0)
Alkaline Phosphatase: 49 U/L (ref 38–126)
Bilirubin, Direct: 0.2 mg/dL (ref 0.0–0.2)
Indirect Bilirubin: 0.4 mg/dL (ref 0.3–0.9)
Total Bilirubin: 0.6 mg/dL (ref 0.0–1.2)
Total Protein: 6.5 g/dL (ref 6.5–8.1)

## 2024-05-12 LAB — MAGNESIUM: Magnesium: 1.9 mg/dL (ref 1.7–2.4)

## 2024-05-12 MED ORDER — MAGNESIUM OXIDE -MG SUPPLEMENT 400 (240 MG) MG PO TABS
400.0000 mg | ORAL_TABLET | ORAL | Status: DC
  Administered 2024-05-12: 400 mg via ORAL
  Filled 2024-05-12: qty 1

## 2024-05-12 MED ORDER — QUETIAPINE FUMARATE 100 MG PO TABS
100.0000 mg | ORAL_TABLET | Freq: Once | ORAL | Status: AC
  Administered 2024-05-12: 100 mg via ORAL
  Filled 2024-05-12: qty 1

## 2024-05-12 MED ORDER — POTASSIUM CHLORIDE CRYS ER 20 MEQ PO TBCR
20.0000 meq | EXTENDED_RELEASE_TABLET | Freq: Every day | ORAL | Status: DC
  Administered 2024-05-12: 20 meq via ORAL
  Filled 2024-05-12: qty 1

## 2024-05-12 MED ORDER — LOSARTAN POTASSIUM 50 MG PO TABS: 100.0000 mg | ORAL_TABLET | Freq: Every day | ORAL | Status: DC

## 2024-05-12 MED ORDER — MAGNESIUM SULFATE 2 GM/50ML IV SOLN: 2.0000 g | Freq: Once | INTRAVENOUS | Status: DC

## 2024-05-12 MED ORDER — LOSARTAN POTASSIUM 100 MG PO TABS
100.0000 mg | ORAL_TABLET | Freq: Every day | ORAL | 12 refills | Status: DC
  Filled 2024-05-12: qty 30, 30d supply, fill #0

## 2024-05-12 MED ORDER — METOPROLOL TARTRATE 100 MG PO TABS: 100.0000 mg | ORAL_TABLET | Freq: Once | ORAL | Status: DC

## 2024-05-12 NOTE — Plan of Care (Signed)
  Problem: Education: Goal: Ability to manage disease process will improve Outcome: Progressing   Problem: Cardiac: Goal: Ability to achieve and maintain adequate cardiopulmonary perfusion will improve Outcome: Progressing   Problem: Clinical Measurements: Goal: Ability to maintain clinical measurements within normal limits will improve Outcome: Progressing   Problem: Clinical Measurements: Goal: Will remain free from infection Outcome: Progressing

## 2024-05-12 NOTE — Progress Notes (Addendum)
 Advanced Heart Failure Rounding Note  Cardiologist: None  Chief Complaint: Post Cardiac Arrest/ Systolic Heart Failure  Subjective:    R/LHC 10/8: EF 55%, mild CAD. RA 7, PA 33/7 (20), PCW 3, CO/CI (fick) 5.0/2.7  Confused/agitated yesterday after cath, attempting to remove TR band and leave hospital. Security was called for safety of patient and staff. Sitter present.  BP improving, remains 130-140s  Frustrated this morning that he is unable to go to his truck to grab extra clothes, states that he would like to leave and come back. Does appear to be confused to situation, however is oriented to place and time. Discussed that he would be unable to leave and come back for this same admission. Feels in normal state of health.   Objective:    Weight Range: 69.5 kg Body mass index is 24 kg/m.   Vital Signs:   Temp:  [98.1 F (36.7 C)-98.6 F (37 C)] 98.4 F (36.9 C) (10/09 0500) Pulse Rate:  [65-89] 71 (10/09 0500) Resp:  [10-20] 18 (10/09 0500) BP: (106-170)/(63-98) 141/89 (10/09 0500) SpO2:  [96 %-100 %] 99 % (10/08 1646) Last BM Date : 05/10/24  Weight change: Filed Weights   05/08/24 2333 05/09/24 0306 05/10/24 0249  Weight: 69.6 kg 69.6 kg 69.5 kg   Intake/Output:  Intake/Output Summary (Last 24 hours) at 05/12/2024 0811 Last data filed at 05/12/2024 0600 Gross per 24 hour  Intake 603 ml  Output 600 ml  Net 3 ml    Physical Exam    General: Well appearing. No distress on RA Cardiac: JVP flat. S1 and S2 present. No murmurs Extremities: Warm and dry.  No peripheral edema.  Neuro: Alert and oriented x3. Affect pleasant. Moves all extremities without difficulty.  Telemetry   Refuses to wear tele  Labs    CBC Recent Labs    05/11/24 0237 05/11/24 1214 05/12/24 0241  WBC 7.8  --  7.3  HGB 12.3* 11.9* 12.8*  HCT 37.7* 35.0* 39.6  MCV 90.0  --  90.6  PLT 218  --  230   Basic Metabolic Panel Recent Labs    89/92/74 0449 05/11/24 0237  05/11/24 1214 05/12/24 0241  NA 134* 133* 137 135  K 4.5 3.7 3.3* 3.8  CL 100 98  --  101  CO2 27 25  --  22  GLUCOSE 76 99  --  103*  BUN <5* <5*  --  <5*  CREATININE 0.70 0.63  --  0.77  CALCIUM 8.5* 8.2*  --  8.7*  MG 2.0 1.9  --  1.9  PHOS 3.4 3.1  --   --    Liver Function Tests Recent Labs    05/11/24 0237 05/12/24 0241  AST 41 36  ALT 26 24  ALKPHOS 52 49  BILITOT 0.7 0.6  PROT 6.2* 6.5  ALBUMIN 2.7* 2.9*   BNP (last 3 results) Recent Labs    12/06/23 0857  BNP 181.4*   Medications:    Scheduled Medications:  docusate sodium  100 mg Oral BID   fluticasone  furoate-vilanterol  1 puff Inhalation Daily   folic acid  1 mg Oral Daily   heparin injection (subcutaneous)  5,000 Units Subcutaneous Q8H   losartan  50 mg Oral Daily   multivitamin with minerals  1 tablet Oral Daily   polyethylene glycol  17 g Oral Daily   QUEtiapine  100 mg Oral Once   revefenacin  175 mcg Nebulization Daily   sodium chloride  flush  3 mL Intravenous Q12H   spironolactone  25 mg Oral Daily   thiamine (VITAMIN B1) injection  100 mg Intravenous Q8H    Infusions:  sodium chloride       PRN Medications: sodium chloride , acetaminophen , ipratropium-albuterol , ondansetron  (ZOFRAN ) IV, mouth rinse, sodium chloride  flush  Patient Profile   63 y/o male w/ h/o asthma, polysubstance use and no known prior cardiac history, admitted post OOH cardiac arrest in setting of ETOH intoxication/drug overdose. UDS + for cocaine and benzos.   Assessment/Plan   1. OOH Cardiac Arrest  - apneic asystole on EMS arrival, 7 min of CPR prior to ROSC  - ethanol level elevated @ 293, UDS + cocaine and benzos - Post arrest EKG accelerated junctional rhythm, no ST changes, QTc 523 ms, K 3.8, Mg 2.1, Hs trop 17>>125>>231, not c/w ACS.  Echo EF 30-35%, GIDD, no RWMA, RV normal. No h/o ischemic symptoms - neurologically intact. Rhythm stable on tele  - mild coronary disease on LHC. Likely related to drug use -  not ideal candidate for ICD given substance use history. Needs to refrain from further substance use   2. Acute Systolic Heart Failure, now improved EF  - Echo post arrest, EF 30-35%, no RWMA, RV normal  - given EF improvement suspect cause was myocardial stunning post arrest - mild CAD on cath. RHC with normal EF, filling pressures and CO - continue GDMT up titration   - increase losartan to 100 mg this afternoon (scheduled to get lopressor for CT). Uninsured, unable to afford Entresto  - continue spironolactone 25 mg daily  - euvolemic on exam, does not need loop diuretic currently  - cardiac CT ordered to define anomalous LAD, although suspect that he will refuse to go for scan  3. CAD - mild RCA disease (20%) on cath   4. Polysubstance Use - needs to avoid, d/w patient  5. Hypertension - BP elevated 140s systolic - push up titration of HF GDMT   6. Hypokalemia - add KCL daily, continue spiro  Length of Stay: 4  [place] Lee, NP  05/12/2024, 8:11 AM  Advanced Heart Failure Team Pager [phone] (M-F; 7a - 5p)  Please contact [facility] for night-coverage after hours (5p -7a ) and weekends on [email or web address]  Patient seen and examined with the above-signed Advanced Practice Provider and/or Housestaff. I personally reviewed laboratory data, imaging studies and relevant notes. I independently examined the patient and formulated the important aspects of the plan. I have edited the note to reflect any of my changes or salient points. I have personally discussed the plan with the patient and/or family.  Patient very agitated this am. Refusing any more testing. Has pulled his IVs and says he is going home. Denies CP or SOB   Multiple providers have tried to d/w him the importance of completeing work-up but he refuses  General: Agitated No resp difficulty HEENT: normal Neck: supple. no JVD. Carotids 2+ bilat; no bruits. No lymphadenopathy or thryomegaly appreciated. Cor: PMI  nondisplaced. Regular rate & rhythm. No rubs, gallops or murmurs. Lungs: clear Abdomen: soft, nontender, nondistended. No hepatosplenomegaly. No bruits or masses. Good bowel sounds. Extremities: no cyanosis, clubbing, rash, edema Neuro: alert & orientedx3, cranial nerves grossly intact. moves all 4 extremities w/o difficulty. Affect agitated  Patient being uncooperative this am. Ideally would get cardiac CT to look for interarterial course of anomalous LAD but he refuses.   Suspect cardiac arrest due to substance abuse and not primarily cardiac.   EF  now recovered. I have emphasized need for substance abuse cessation. Unable to use full GDMT due to drug use and unwillingness to f/u.   Ok for d/c from our standpoint. Have started losartan.   Toribio Fuel, MD  3:08 PM

## 2024-05-12 NOTE — TOC CM/SW Note (Signed)
..  05/12/2024  Tony Calderon DOB: 11-29-1960 MRN: [id number]   RIDER WAIVER AND RELEASE OF LIABILITY  For the purposes of helping with transportation needs, [facility] partners with outside transportation providers (taxi companies, [place], Catering manager.) to give [facility] patients or other approved people the choice of on-demand rides Public librarian) to our buildings for non-emergency visits.  By using [employer], I, the person signing this document, on behalf of myself and/or any legal minors (in my care using the [employer]), agree:  Science writer given to me are supplied by independent, outside transportation providers who do not work for, or have any affiliation with, [employer]. [facility] is not a transportation company. [facility] has no control over the quality or safety of the rides I get using [employer]. [facility] has no control over whether any outside ride will happen on time or not. [facility] gives no guarantee on the reliability, quality, safety, or availability on any rides, or that no mistakes will happen. I know and accept that traveling by vehicle (car, truck, SVU, fleeta, bus, taxi, etc.) has risks of serious injuries such as disability, being paralyzed, and death. I know and agree the risk of using [employer] is mine alone, and not [employer]. [employer] are provided as is and as are available. The transportation providers are in charge for all inspections and care of the vehicles used to provide these rides. I agree not to take legal action against [facility], its agents, employees, officers, directors, representatives, insurers, attorneys, assigns, successors, subsidiaries, and affiliates at any time for any reasons related directly or indirectly to using [employer]. I also agree not to take legal action against [facility] or its affiliates for any injury, death, or damage to property caused by or related to  using [employer]. I have read this Waiver and Release of Liability, and I understand the terms used in it and their legal meaning. This Waiver is freely and voluntarily given with the understanding that my right (or any legal minors) to legal action against [facility] relating to [employer] is knowingly given up to use these services.   I attest that I read the Ride Waiver and Release of Liability to Tony Calderon, gave Mr. Bradish the opportunity to ask questions and answered the questions asked (if any). I affirm that Tony Calderon then provided consent for assistance with transportation.

## 2024-05-12 NOTE — Discharge Summary (Signed)
 [facility] Phys[facility] Discharge Summary   Patient name: Tony Calderon  Admit date:     05/08/2024  Discharge date: 05/12/2024  Attending Physician: CLAUDENE TORIBIO BROCKS [[id number]]  Discharge Physician: Camellia Door   PCP: Tanda Bleacher, MD  Admitted From: Home  Disposition:  Home  Recommendations for Outpatient Follow-up:  Follow up with PCP in 1-2 weeks Follow up with cardiology as scheduled  Home Health:No Equipment/Devices: None    Discharge Condition:Stable CODE STATUS:FULL Diet recommendation: Heart Healthy Fluid Restriction: None  Hospital Summary: CC: unresponsive, CPR HPI: 63 year old man w/ hx of alcohol dependence, withdrawal seizures who was found unresponsive at a gas station after taking a pill? Apneic asystolic on EMS arrival. 7 min CPR. Comatose on arrival to ER. EtOH up, +cocaine, benzo. CT head pending, PCCM asked to admit.   Significant Events: Admitted 05/08/2024 to ICU by PCCM, s/p asystole. CPR with ROSC after 7 mins. Intubated by EMS 05-09-2024 extubated. Cardiology consulted due to echo showing LVEF 30%. Recommending LHC 05-10-2024 transferred to [facility] care 05-11-2024 RHC/LHC showed 1. 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 Normal EF 55% 5. Well-compensated filling pressures  Admission Labs: WBC 9.3, HgB 13.7, plt 231 Na 137, K 3.8, CO2 of 21, BUN 5, Scr 0.87, glu 113 T prot 7.4, alb 3.2, AST 119, ALT 43, alk phos 69, t. Bili 0.5 ETOH 293 NH3 of 53 Urine drug screen +cocaine, benzo  Admission Imaging Studies: CXR Endotracheal tube tip slightly high, approximately 7 cm above the carina; consider advancement for optimal positioning. 2. Enteric tube with tip and side port in the mid stomach, appropriate position 3. No acute cardiopulmonary process identified. 4. Aortic arch atherosclerosis. Abd XR Enteric tube appropriately positioned with tip in the gastric body.   CT head No acute intracranial abnormality.   Significant Labs:   Significant Imaging Studies: RHC/LHC Findings:   Ao =105/68 (83) LV = 112/6  RA =  7 RV = 33/5 PA = 33/7 (20) PCW = 3 Fick cardiac output/index = 5.0/2.7 PVR = 3.2 Ao sat = 96% PA sat = 67%     Assessment: 1. 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 Normal EF 55% 5. Well-compensated filling pressures  Antibiotic Therapy: Anti-infectives (From admission, onward)    None       Procedures: 05-08-2024 intubated by EMS. CPR with ROSC at 7 mins 05-09-2024 extubated to 4 L/min [place] 05-11-2024 RHC/LHC   Consultants: [facility] cardiology   Hospital Course by Problem: * Cardiac arrest (HCC) 05/11/24 likely due to cocaine abuse. LHC/RHC today did not show any significant CAD. 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 Appears cardiology going to get a cardiac CT to better define his anomalous LAD. Pt does not appear to have suffered any serious neurologic consequences.  05/12/24 cards discharged pt on losartan 50 mg daily. F/u with outpatient cards.    COPD (chronic obstructive pulmonary disease) (HCC) 05/11/24 restart his home inhalers. Prn duonebs.   Cocaine abuse (HCC) 05/11/24 pt advised to stop all illicit drug use.   Wheezing 05/11/24 long hx of tobacco abuse. Restart his home inhaler. Not wheezing anymore. Resolved.   Alcohol abuse 05/11/24 pt advised to stop all etoh and illicit drug use.   Acute respiratory failure with hypoxia (HCC)-resolved as of 05/11/2024 05/11/24 admitted  on 05-08-2024 intubated. He was extubated to 4 L/min on 05-09-2024. He is now on RA. Resolved.     Discharge Diagnoses:  Principal Problem:   Cardiac arrest [facility]) Active Problems:   COPD (chronic obstructive pulmonary disease) (HCC)   Alcohol abuse   Wheezing   Cocaine abuse [facility])   Discharge  Instructions  Discharge Instructions     (HEART FAILURE PATIENTS) Call MD:  Anytime you have any of the following symptoms: 1) 3 pound weight gain in 24 hours or 5 pounds in 1 week 2) shortness of breath, with or without a dry hacking cough 3) swelling in the hands, feet or stomach 4) if you have to sleep on extra pillows at night in order to breathe.   Complete by: As directed    Diet - low sodium heart healthy   Complete by: As directed    Increase activity slowly   Complete by: As directed       Allergies as of 05/12/2024   No Known Allergies      Medication List     STOP taking these medications    acetaminophen  500 MG tablet Commonly known as: TYLENOL        TAKE these medications    albuterol  108 (90 Base) MCG/ACT inhaler Commonly known as: VENTOLIN  HFA Inhale 1-2 puffs into the lungs every 6 (six) hours as needed for wheezing or shortness of breath.   budesonide -formoterol  160-4.5 MCG/ACT inhaler Commonly known as: SYMBICORT  Inhale 2 puffs into the lungs 2 (two) times daily. What changed:  when to take this reasons to take this   losartan 100 MG tablet Commonly known as: COZAAR Take 1 tablet (100 mg total) by mouth daily.        Follow-up Information     Tanda Bleacher, MD. Go in 14 day(s).   Specialty: Family Medicine Why: Hospital follow up appointment scheduled for Wednesday, May 25, 2024 at 10:00 AM.  PLEASE ARRIVE 10-15 minutes early.  PLEASE and cancel/reschedule if you CANNOT make appointment. Contact information: [address] [place] [address] [phone]                No Known Allergies  Discharge Exam: Vitals:   05/12/24 0720 05/12/24 1016  BP:  106/72  Pulse: 92   Resp: 20 18  Temp: 98.2 F (36.8 C) 98.7 F (37.1 C)  SpO2: 98% 95%    The results of significant diagnostics from this hospitalization (including imaging, microbiology, ancillary and laboratory) are listed below for reference.     Microbiology: Recent Results (from the past 240 hours)  MRSA Next Gen by PCR, Nasal     Status: None   Collection Time: 05/08/24 11:31 PM   Specimen: Nasal Mucosa; Nasal Swab  Result Value Ref Range Status   MRSA by PCR Next Gen NOT DETECTED NOT DETECTED Final    Comment: (NOTE) The GeneXpert MRSA Assay (FDA approved for NASAL specimens only), is one component of a comprehensive MRSA colonization surveillance program. It is not intended to diagnose MRSA infection nor to guide or monitor treatment for MRSA infections. Test performance is not FDA approved in patients less than 20 years old. Performed at [facility], [address] [place], [place] [address]      Labs: BNP (last 3 results) Recent Labs    12/06/23 0857  BNP 181.4*   Basic Metabolic Panel: Recent Labs  Lab 05/08/24 2057 05/08/24 2205 05/09/24 0222 05/09/24 9373 05/10/24 0449 05/11/24 0237 05/11/24  1214 05/12/24 0241  NA 137   < > 136 136 134* 133* 137 135  K 3.8   < > 3.7 4.3 4.5 3.7 3.3* 3.8  CL 101  --  106  --  100 98  --  101  CO2 21*  --  19*  --  27 25  --  22  GLUCOSE 113*  --  85  --  76 99  --  103*  BUN 5*  --  6*  --  <5* <5*  --  <5*  CREATININE 0.87  --  0.61  --  0.70 0.63  --  0.77  CALCIUM 8.1*  --  7.3*  --  8.5* 8.2*  --  8.7*  MG 2.1  --  1.7  --  2.0 1.9  --  1.9  PHOS  --   --  3.6  --  3.4 3.1  --   --    < > = values in this interval not displayed.   Liver Function Tests: Recent Labs  Lab 05/08/24 2057 05/09/24 0222 05/10/24 0449 05/11/24 0237 05/12/24 0241  AST 119* 83* 66* 41 36  ALT 43 36 33 26 24  ALKPHOS 69 53 56 52 49  BILITOT 0.5 0.4 1.2 0.7 0.6  PROT 7.4 5.7* 6.4* 6.2* 6.5  ALBUMIN 3.2* 2.6* 2.8* 2.7* 2.9*    Recent Labs  Lab 05/08/24 2057  AMMONIA 54*   CBC: Recent Labs  Lab 05/08/24 2057 05/08/24 2205 05/09/24 0222 05/09/24 0626 05/10/24 0449 05/11/24 0237 05/11/24 1214 05/12/24 0241  WBC 9.3  --  7.1  --  7.8 7.8  --  7.3   NEUTROABS 2.8  --   --   --   --   --   --   --   HGB 13.7   < > 11.5* 10.9* 12.4* 12.3* 11.9* 12.8*  HCT 43.7   < > 34.5* 32.0* 38.9* 37.7* 35.0* 39.6  MCV 93.4  --  90.6  --  91.3 90.0  --  90.6  PLT 231  --  205  --  195 218  --  230   < > = values in this interval not displayed.   Cardiac Enzymes: Recent Labs  Lab 05/08/24 2057 05/08/24 2348 05/09/24 0508  TROPONINIHS 17 125* 231*   CBG: Recent Labs  Lab 05/11/24 0005 05/11/24 0436 05/11/24 0714 05/11/24 1104 05/11/24 1721  GLUCAP 86 76 75 99 116*   Urinalysis    Component Value Date/Time   COLORURINE YELLOW 05/08/2024 2113   APPEARANCEUR HAZY (A) 05/08/2024 2113   LABSPEC 1.005 05/08/2024 2113   PHURINE 5.0 05/08/2024 2113   GLUCOSEU NEGATIVE 05/08/2024 2113   HGBUR SMALL (A) 05/08/2024 2113   BILIRUBINUR NEGATIVE 05/08/2024 2113   KETONESUR NEGATIVE 05/08/2024 2113   PROTEINUR 30 (A) 05/08/2024 2113   NITRITE NEGATIVE 05/08/2024 2113   LEUKOCYTESUR NEGATIVE 05/08/2024 2113   Sepsis Labs Recent Labs  Lab 05/09/24 0222 05/10/24 0449 05/11/24 0237 05/12/24 0241  WBC 7.1 7.8 7.8 7.3    Procedures/Studies: CARDIAC CATHETERIZATION Result Date: 05/11/2024   Prox RCA to Mid RCA lesion is 20% stenosed. Findings: Ao =105/68 (83) LV = 112/6 RA =  7 RV = 33/5 PA = 33/7 (20) PCW = 3 Fick cardiac output/index = 5.0/2.7 PVR = 3.2 Ao sat = 96% PA sat = 67% Assessment: 1. 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 Small RCA-R PA  fistula 4. Normal EF 55% 5. Well-compensated filling pressures Plan/Discussion: He has had LV recovery. Will get cardiac CT to better define course of anomalous LAD in setting of cardiac arrest. Though given asystolic arrest suspect may be more related to drug intoxication. Toribio Fuel, MD 12:49 PM  ECHOCARDIOGRAM COMPLETE Result Date: 05/09/2024    ECHOCARDIOGRAM REPORT   Patient Name:   Tony Calderon Date of Exam: 05/09/2024  Medical Rec #:  [id number]           Height:       67.0 in Accession #:    [id number]          Weight:       153.4 lb Date of Birth:  1960/10/25            BSA:          1.807 m Patient Age:    63 years            BP:           91/63 mmHg Patient Gender: M                   HR:           66 bpm. Exam Location:  Inpatient Procedure: 2D Echo, Cardiac Doppler, Color Doppler and Intracardiac            Opacification Agent (Both Spectral and Color Flow Doppler were            utilized during procedure). Indications:    Cardiac arrest I46.9  History:        Patient has no prior history of Echocardiogram examinations.                 Risk Factors:Hypertension.  Sonographer:    Jayson Gaskins Referring Phys: [id number] TORIBIO BROCKS SMITH IMPRESSIONS  1. Left ventricular ejection fraction, by estimation, is 30 to 35%. The left ventricle has moderately decreased function. The left ventricle has no regional wall motion abnormalities. Left ventricular diastolic parameters are consistent with Grade I diastolic dysfunction (impaired relaxation).  2. Right ventricular systolic function is normal. The right ventricular size is normal.  3. The mitral valve is normal in structure. No evidence of mitral valve regurgitation. No evidence of mitral stenosis.  4. The aortic valve is normal in structure. Aortic valve regurgitation is not visualized. No aortic stenosis is present.  5. The inferior vena cava is normal in size with greater than 50% respiratory variability, suggesting right atrial pressure of 3 mmHg. FINDINGS  Left Ventricle: Left ventricular ejection fraction, by estimation, is 30 to 35%. The left ventricle has moderately decreased function. The left ventricle has no regional wall motion abnormalities. Definity contrast agent was given IV to delineate the left ventricular endocardial borders. The left ventricular internal cavity size was normal in size. There is no left ventricular hypertrophy. Left ventricular diastolic parameters are  consistent with Grade I diastolic dysfunction (impaired relaxation).  LV Wall Scoring: The mid and distal anterior septum is dyskinetic. The entire inferior wall, mid inferoseptal segment, and apex are akinetic. Right Ventricle: The right ventricular size is normal. No increase in right ventricular wall thickness. Right ventricular systolic function is normal. Left Atrium: Left atrial size was normal in size. Right Atrium: Right atrial size was normal in size. Pericardium: There is no evidence of pericardial effusion. Mitral Valve: The mitral valve is normal in structure. No evidence of mitral valve regurgitation. No evidence of mitral valve stenosis.  Tricuspid Valve: The tricuspid valve is normal in structure. Tricuspid valve regurgitation is trivial. No evidence of tricuspid stenosis. Aortic Valve: The aortic valve is normal in structure. Aortic valve regurgitation is not visualized. No aortic stenosis is present. Aortic valve mean gradient measures 3.0 mmHg. Aortic valve peak gradient measures 5.0 mmHg. Aortic valve area, by VTI measures 3.16 cm. Pulmonic Valve: The pulmonic valve was normal in structure. Pulmonic valve regurgitation is not visualized. No evidence of pulmonic stenosis. Aorta: The aortic root is normal in size and structure. Venous: The inferior vena cava is normal in size with greater than 50% respiratory variability, suggesting right atrial pressure of 3 mmHg. IAS/Shunts: No atrial level shunt detected by color flow Doppler.  LEFT VENTRICLE PLAX 2D LVIDd:         4.10 cm   Diastology LVIDs:         3.10 cm   LV e' medial:    6.42 cm/s LV PW:         1.50 cm   LV E/e' medial:  10.0 LV IVS:        0.90 cm   LV e' lateral:   6.85 cm/s LVOT diam:     1.90 cm   LV E/e' lateral: 9.4 LV SV:         58 LV SV Index:   32 LVOT Area:     2.84 cm  RIGHT VENTRICLE RV S prime:     10.90 cm/s TAPSE (M-mode): 1.7 cm LEFT ATRIUM             Index        RIGHT ATRIUM           Index LA Vol (A2C):   45.4 ml  25.13 ml/m  RA Area:     13.30 cm LA Vol (A4C):   22.9 ml 12.67 ml/m  RA Volume:   31.10 ml  17.21 ml/m LA Biplane Vol: 33.0 ml 18.26 ml/m  AORTIC VALVE AV Area (Vmax):    2.78 cm AV Area (Vmean):   2.73 cm AV Area (VTI):     3.16 cm AV Vmax:           112.00 cm/s AV Vmean:          72.400 cm/s AV VTI:            0.185 m AV Peak Grad:      5.0 mmHg AV Mean Grad:      3.0 mmHg LVOT Vmax:         110.00 cm/s LVOT Vmean:        69.700 cm/s LVOT VTI:          0.206 m LVOT/AV VTI ratio: 1.11  AORTA Ao Root diam: 2.80 cm MITRAL VALVE MV Area (PHT): 3.91 cm    SHUNTS MV Decel Time: 194 msec    Systemic VTI:  0.21 m MV E velocity: 64.50 cm/s  Systemic Diam: 1.90 cm MV A velocity: 71.30 cm/s MV E/A ratio:  0.90 Oneil Parchment MD Electronically signed by Oneil Parchment MD Signature Date/Time: 05/09/2024/3:07:39 PM    Final    EEG adult Result Date: 05/09/2024 Shelton Arlin KIDD, MD     05/09/2024 11:26 AM Patient Name: Elsie FORBES Aran Calderon MRN: [id number] Epilepsy Attending: Arlin KIDD Shelton Referring Physician/Provider: Claudene Toribio BROCKS, MD Date: 05/09/2024 Duration: 22.33 mins Patient history: 63 year old man w/ hx of alcohol dependence, withdrawal seizures who was found unresponsive at a gas station after taking  a pill? Apneic asystolic on EMS arrival. 7 min CPR. Comatose on arrival to ER. EtOH up, +cocaine, benzo. EG to evaluate for seizure Level of alertness:  comatose AEDs during EEG study: Propofol Technical aspects: This EEG study was done with scalp electrodes positioned according to the 10-20 International system of electrode placement. Electrical activity was reviewed with band pass filter of 1-70Hz , sensitivity of 7 uV/mm, display speed of 103mm/sec with a 60Hz  notched filter applied as appropriate. EEG data were recorded continuously and digitally stored.  Video monitoring was available and reviewed as appropriate. Description: EEG showed continuous generalized 3 to 6 Hz theta-delta slowing admixed with 13-15hz   beta activity distributed symmetrically and diffusely. Hyperventilation and photic stimulation were not performed.   ABNORMALITY - Continuous slow, generalized IMPRESSION: This study is suggestive of severe diffuse encephalopathy, nonspecific etiology but likely related to sedation. No seizures or epileptiform discharges were seen throughout the recording. Priyanka O Yadav   CT Head Wo Contrast Result Date: 05/08/2024 CLINICAL DATA:  Mental status change, unknown cause EXAM: CT HEAD WITHOUT CONTRAST TECHNIQUE: Contiguous axial images were obtained from the base of the skull through the vertex without intravenous contrast. RADIATION DOSE REDUCTION: This exam was performed according to the departmental dose-optimization program which includes automated exposure control, adjustment of the mA and/or kV according to patient size and/or use of iterative reconstruction technique. COMPARISON:  Head CT 06/23/2023 FINDINGS: Brain: No intracranial hemorrhage, mass effect, or midline shift. Brain volume is normal for age. No hydrocephalus. The basilar cisterns are patent. No evidence of territorial infarct or acute ischemia. No extra-axial or intracranial fluid collection. Vascular: Atherosclerosis of skullbase vasculature without hyperdense vessel or abnormal calcification. Skull: No fracture or focal lesion. Sinuses/Orbits: Intubation with orogastric tube placement. Mucosal thickening in the paranasal sinuses may be related to intubation. Other: Retained secretions in the pharynx. IMPRESSION: No acute intracranial abnormality. Electronically Signed   By: Andrea Gasman M.D.   On: 05/08/2024 22:38   DG Abd Portable 1 View Result Date: 05/08/2024 EXAM: 1 VIEW XRAY OF THE ABDOMEN 05/08/2024 09:33:00 PM COMPARISON: None available. CLINICAL HISTORY: OG tube insertion. Patient presented with altered mental status and unresponsiveness. History of pill ingestion. EMS performed CPR and administered epinephrine. FINDINGS:  LINES, TUBES AND DEVICES: Enteric tube coursing below the diaphragm with distal tip terminating within the expected location of the gastric body. BOWEL: Nonobstructive bowel gas pattern. SOFT TISSUES: No opaque urinary calculi. BONES: No acute osseous abnormality. IMPRESSION: 1. Enteric tube appropriately positioned with tip in the gastric body. Electronically signed by: Franky Stanford MD 05/08/2024 09:58 PM EDT RP Workstation: [id number]   DG Chest Port 1 View Result Date: 05/08/2024 EXAM: 1 VIEW(S) XRAY OF THE CHEST 05/08/2024 09:33:00 PM COMPARISON: 12/06/2023 CLINICAL HISTORY: Altered mental status. Reason for exam- chest: AMS. Reason for exam- abdomen: OG tube insertion. Patient was unresponsive, received CPR, and reportedly took a pill prior to EMS arrival. FINDINGS: LINES, TUBES AND DEVICES: Endotracheal tube in place with tip 7 cm above the carina just above the level of the clavicular heads. Enteric tube in place with tip and side port positioned within the mid stomach. LUNGS AND PLEURA: No focal pulmonary opacity, pulmonary edema, pleural effusion, or pneumothorax. HEART AND MEDIASTINUM: Aortic arch atherosclerosis. No acute abnormality of the cardiac and mediastinal silhouettes. BONES AND SOFT TISSUES: No acute osseous abnormality. IMPRESSION: 1. Endotracheal tube tip slightly high, approximately 7 cm above the carina; consider advancement for optimal positioning. 2. Enteric tube with tip and side port  in the mid stomach, appropriate position. 3. No acute cardiopulmonary process identified. 4. Aortic arch atherosclerosis. Electronically signed by: Franky Stanford MD 05/08/2024 09:57 PM EDT RP Workstation: [id number]    Time coordinating discharge: 60 mins  SIGNED:  Camellia Door, DO [facility] 05/12/24, 12:39 PM

## 2024-05-12 NOTE — TOC CM/SW Note (Signed)
  MATCH MEDICATION ASSISTANCE CARD Pharmacies please call [phone] for claim processing assistance.  Rx BIN: [id number] Rx Group: [id number] Rx PCN: [id number] Relationship Code: 1 Person Code: 01  Patient ID (MRN): [name]    Patient Name: Tony Calderon   Patient DOB: Apr 09, 1961   Discharge Date: 05/12/2024  Expiration Date: (must be filled within 7 days of discharge)     Medication Assistance/No insurance Medication Assistance through [facility]) program with $3 copay for each Rx excludes Narcotics, does not include refills with a once per year use with expiration after 7 days at participating pharmacies. After [facility] review, pt eligible for MATCH. Pt has accepted to participate in program under terms discussed.    Delcia Crick RN CCM Case Mgmt phone [phone]

## 2024-05-12 NOTE — TOC Transition Note (Signed)
 Transition of Care [facility]) - Discharge Note   Patient Details  Name: Tony Calderon MRN: [id number] Date of Birth: 11-18-1960  Transition of Care [facility]) CM/SW Contact:  Justina Delcia Czar, RN Phone Number: [phone] 05/12/2024, 11:27 AM   Clinical Narrative:     Patient agreeable to dc to his sister, [name] home at [address] [place] [address].   MATCH will used for his medication.   Pt will need transport to sister home. Taxi will be arranged in [facility] Discharge Lounge.   Final next level of care: Home/Self Care Barriers to Discharge: No Barriers Identified   Patient Goals and CMS Choice Patient states their goals for this hospitalization and ongoing recovery are:: wants to get better          Discharge Placement                       Discharge Plan and Services Additional resources added to the After Visit Summary for     Discharge Planning Services: CM Consult                                 Social Drivers of Health (SDOH) Interventions SDOH Screenings   Food Insecurity: No Food Insecurity (05/09/2024)  Housing: Low Risk  (05/09/2024)  Transportation Needs: Unmet Transportation Needs (05/09/2024)  Utilities: Patient Declined (05/09/2024)  Social Connections: Moderately Integrated (05/09/2024)  Tobacco Use: Low Risk  (05/08/2024)     Readmission Risk Interventions     No data to display

## 2024-05-13 LAB — LIPOPROTEIN A (LPA): Lipoprotein (a): 93.1 nmol/L — ABNORMAL HIGH (ref <75.0)

## 2024-05-23 ENCOUNTER — Telehealth (HOSPITAL_COMMUNITY): Payer: Self-pay

## 2024-05-23 NOTE — Telephone Encounter (Signed)
 Called to confirm/remind patient of their appointment [facility] on 05/24/24 11:30.   Appointment:   [] Confirmed  [x] Left mess   [] No answer/No voice mail  [] VM Full/unable to leave message  [] Phone not in service  Patient reminded to bring all medications and/or complete list.  Confirmed patient has transportation. Gave directions, instructed to utilize valet parking.

## 2024-05-24 ENCOUNTER — Encounter (HOSPITAL_COMMUNITY): Payer: Self-pay

## 2024-05-24 ENCOUNTER — Other Ambulatory Visit: Payer: Self-pay

## 2024-05-24 ENCOUNTER — Encounter: Payer: Self-pay | Admitting: Gerontology

## 2024-05-24 ENCOUNTER — Ambulatory Visit: Payer: Self-pay | Admitting: Gerontology

## 2024-05-24 VITALS — BP 167/90 | HR 71 | Ht 64.0 in | Wt 149.2 lb

## 2024-05-24 DIAGNOSIS — I10 Essential (primary) hypertension: Secondary | ICD-10-CM | POA: Insufficient documentation

## 2024-05-24 DIAGNOSIS — Z8709 Personal history of other diseases of the respiratory system: Secondary | ICD-10-CM | POA: Insufficient documentation

## 2024-05-24 DIAGNOSIS — J4551 Severe persistent asthma with (acute) exacerbation: Secondary | ICD-10-CM

## 2024-05-24 DIAGNOSIS — Z7689 Persons encountering health services in other specified circumstances: Secondary | ICD-10-CM | POA: Insufficient documentation

## 2024-05-24 DIAGNOSIS — Z8659 Personal history of other mental and behavioral disorders: Secondary | ICD-10-CM | POA: Insufficient documentation

## 2024-05-24 DIAGNOSIS — J449 Chronic obstructive pulmonary disease, unspecified: Secondary | ICD-10-CM

## 2024-05-24 MED ORDER — FLUTICASONE-SALMETEROL 100-50 MCG/ACT IN AEPB
1.0000 | INHALATION_SPRAY | Freq: Two times a day (BID) | RESPIRATORY_TRACT | 0 refills | Status: AC
  Filled 2024-05-24: qty 60, 30d supply, fill #0

## 2024-05-24 MED ORDER — ALBUTEROL SULFATE HFA 108 (90 BASE) MCG/ACT IN AERS
1.0000 | INHALATION_SPRAY | Freq: Four times a day (QID) | RESPIRATORY_TRACT | 3 refills | Status: AC | PRN
  Filled 2024-05-24: qty 6.7, 30d supply, fill #0

## 2024-05-24 MED ORDER — LOSARTAN POTASSIUM 50 MG PO TABS
50.0000 mg | ORAL_TABLET | Freq: Every day | ORAL | 0 refills | Status: AC
  Filled 2024-05-24: qty 30, 30d supply, fill #0

## 2024-05-24 NOTE — Patient Instructions (Signed)

## 2024-05-24 NOTE — Progress Notes (Incomplete)
 ADVANCED HF CLINIC CONSULT NOTE  Primary Care: Tanda Bleacher, MD Primary Cardiologist: None HF Cardiologist: Dr. Zenaida  HPI: Tony Calderon is 63 y.o. 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   Admitted post OOH cardiac arrest 05/08/24. Was found unresponsive outside of a gas station, approx 7 min CPR. In asystole on EMS arrival. Head CT no acute intracranial abnormality. ETOH elevated at 293. UDS + for cocaine and benzos. Post arrest EKG showed accelerated junctional rhythm, no ST changes. EF with some recovery by cath 05/11/24 EF 55%. Agitated after cath, required security and sitter. Attempted to obtain cardiac CT to eval LAD anomaly, however patient refused to stay for scan.   He returns today for post hospital follow up. Overall feeling ***. NYHA ***. Reports {Symptoms; cardiac:12860::dyspnea,fatigue}. Denies {Symptoms; cardiac:12860::chest pain,dyspnea,fatigue,near-syncope,orthopnea,palpitations,dizziness,abnormal bleeding}. Able to perform ADLs. Appetite okay. Weight at home ***. BP at home***. Compliant with all medications.  Cardiac Studies: - R/LHC 10/25: EF 55%, mild CAD, RA 7, PA 33/7 (20), PCW 3, CO/CI 5.0/2.7. Min CAD with anomalous LAD - Echo 10/25: Echo EF 30-35%, GIDD, no RWMA, RV normal.   Past Medical History:  Diagnosis Date   Allergy    Asthma    Hypertension    Seizures (HCC)     Current Outpatient Medications  Medication Sig Dispense Refill   albuterol  (VENTOLIN  HFA) 108 (90 Base) MCG/ACT inhaler Inhale 1-2 puffs into the lungs every 6 (six) hours as needed for wheezing or shortness of breath.     budesonide -formoterol  (SYMBICORT ) 160-4.5 MCG/ACT inhaler Inhale 2 puffs into the lungs 2 (two) times daily. (Patient taking differently: Inhale 2 puffs into the lungs 2 (two) times daily as needed (Asthma).) 1 each 0   losartan (COZAAR) 100 MG tablet Take 1 tablet (100 mg  total) by mouth daily. 30 tablet 12   No current facility-administered medications for this visit.    No Known Allergies    Social History   Socioeconomic History   Marital status: Significant Other    Spouse name: Not on file   Number of children: Not on file   Years of education: Not on file   Highest education level: Not on file  Occupational History   Not on file  Tobacco Use   Smoking status: Never   Smokeless tobacco: Never  Vaping Use   Vaping status: Never Used  Substance and Sexual Activity   Alcohol use: Yes   Drug use: No   Sexual activity: Not Currently  Other Topics Concern   Not on file  Social History Narrative   Not on file   Social Drivers of Health   Financial Resource Strain: Not on file  Food Insecurity: No Food Insecurity (05/09/2024)   Hunger Vital Sign    Worried About Running Out of Food in the Last Year: Never true    Ran Out of Food in the Last Year: Never true  Transportation Needs: Unmet Transportation Needs (05/09/2024)   PRAPARE - Administrator, Civil Service (Medical): Yes    Lack of Transportation (Non-Medical): Yes  Physical Activity: Not on file  Stress: Not on file  Social Connections: Moderately Integrated (05/09/2024)   Social Connection and Isolation Panel    Frequency of Communication with Friends and Family: Never    Frequency of Social Gatherings with Friends and Family: Twice a week    Attends Religious Services: 1 to 4 times per year    Active  Member of Clubs or Organizations: No    Attends Engineer, structural: 1 to 4 times per year    Marital Status: Married  Catering manager Violence: Not At Risk (05/09/2024)   Humiliation, Afraid, Rape, and Kick questionnaire    Fear of Current or Ex-Partner: No    Emotionally Abused: No    Physically Abused: No    Sexually Abused: No    Family History  Problem Relation Age of Onset   Cancer Mother    Hypertension Father    Heart disease Father    There  were no vitals taken for this visit.  There were no vitals filed for this visit.  PHYSICAL EXAM: General: Well appearing. No distress on RA Cardiac: JVP ***. S1 and S2 present. No murmurs or rub. Resp: Lung sounds clear and equal B/L Abdomen: Soft, non-tender, non-distended.  Extremities: Warm and dry.  *** edema.  Neuro: Alert and oriented x3. Affect pleasant. Moves all extremities without difficulty.  ECG (personally reviewed):  ASSESSMENT & PLAN:  2. Chronic Systolic Heart Failure, now improved EF  - Echo post arrest, EF 30-35%, no RWMA, RV normal  - given EF improvement suspect cause was myocardial stunning post arrest - mild CAD on cath. RHC with normal EF, filling pressures and CO - continue GDMT up titration   - increase losartan to 100 mg this afternoon (scheduled to get lopressor for CT). Uninsured, unable to afford Entresto  - continue spironolactone 25 mg daily  - euvolemic on exam, does not need loop diuretic currently  - cardiac CT ordered to define anomalous LAD, although suspect that he will refuse to go for scan  1. H/o Cardiac Arrest  - apneic asystole on EMS arrival, 7 min of CPR prior to ROSC  - ethanol level elevated @ 293, UDS + cocaine and benzos - Post arrest EKG accelerated junctional rhythm, no ST changes, QTc 523 ms, K 3.8, Mg 2.1, Hs trop 17>>125>>231, not c/w ACS.  Echo EF 30-35%, GIDD, no RWMA, RV normal. No h/o ischemic symptoms - neurologically intact. Rhythm stable on tele  - mild coronary disease on LHC. Likely related to drug use - not ideal candidate for ICD given substance use history. Needs to refrain from further substance use   3. CAD - mild RCA disease (20%) on cath   4. Polysubstance Use - needs to avoid, d/w patient   5. Hypertension - BP elevated 140s systolic - push up titration of HF GDMT    6. Hypokalemia - add KCL daily, continue spiro  Follow up in *** with ***  [place] Lexis Potenza, NP 05/24/24

## 2024-05-24 NOTE — Progress Notes (Signed)
 New Patient Office Visit  Subjective    Patient ID: Tony Calderon, male    DOB: [birth year]  Age: 63 y.o. MRN: [id number]  CC:  Chief Complaint  Patient presents with   [facility] f/u/med refills/ anxiety     HPI Tony Calderon is a 63 y/o male who has history of ETOH, COPD, Asthma presents to establish care after hospital discharge.He was admitted to the hospital on 05/08/24 for withdrawal seizures who was found unresponsive at a gas station after taking a pill? Apneic asystolic on EMS arrival. 7 min CPR. Comatose on arrival to ER. EtOH up, +cocaine, benzo . He has history of COPD, and Asthma,  denies smoking, uses inhaler, but has been out for some weeks. He endorses intermittent shortness of breath, wheezing,  and chest tightness, but denies cough. He reports history of high blood pressure,  blood pressure elevated during visit at  167/90, states that he is out of Losartan for many weeks. He denies chest pain, palpitation, vision changes and headache. He reports drinking  2 bottles of beers yesterday,  states that he drinks a beer or 2 daily, with occasional 4-5 drinks during the weekend, denies withdrawal symptoms. He states that he has not gotten any DWI, and has no problem with controlling his alcohol consumption. He endorses being anxious and unable to sleep. He states that his mood is good, denies suicidal nor homicidal ideation. Overall, he states that he's doing well and offers no further complaints.      Outpatient Encounter Medications as of 05/24/2024  Medication Sig   fluticasone -salmeterol (WIXELA INHUB) 100-50 MCG/ACT AEPB Inhale 1 puff into the lungs 2 (two) times daily.   albuterol  (VENTOLIN  HFA) 108 (90 Base) MCG/ACT inhaler Inhale 1-2 puffs into the lungs every 6 (six) hours as needed for wheezing or shortness of breath.   losartan (COZAAR) 50 MG tablet Take 1 tablet (50 mg total) by mouth daily.   [DISCONTINUED] albuterol  (VENTOLIN  HFA) 108 (90 Base)  MCG/ACT inhaler Inhale 1-2 puffs into the lungs every 6 (six) hours as needed for wheezing or shortness of breath. (Patient not taking: Reported on 05/24/2024)   [DISCONTINUED] budesonide -formoterol  (SYMBICORT ) 160-4.5 MCG/ACT inhaler Inhale 2 puffs into the lungs 2 (two) times daily. (Patient not taking: Reported on 05/24/2024)   [DISCONTINUED] losartan (COZAAR) 100 MG tablet Take 1 tablet (100 mg total) by mouth daily. (Patient not taking: Reported on 05/24/2024)   No facility-administered encounter medications on file as of 05/24/2024.    Past Medical History:  Diagnosis Date   Allergy    Asthma    Hypertension    Seizures (HCC)     Past Surgical History:  Procedure Laterality Date   ANKLE ARTHROCENTESIS Right    RIGHT/LEFT HEART CATH AND CORONARY ANGIOGRAPHY N/A 05/11/2024   Procedure: RIGHT/LEFT HEART CATH AND CORONARY ANGIOGRAPHY;  Surgeon: Cherrie Toribio SAUNDERS, MD;  Location: [facility];  Service: Cardiovascular;  Laterality: N/A;    Family History  Problem Relation Age of Onset   Cancer Mother    Hypertension Father    Heart disease Father     Social History   Socioeconomic History   Marital status: Significant Other    Spouse name: Not on file   Number of children: Not on file   Years of education: Not on file   Highest education level: Not on file  Occupational History   Not on file  Tobacco Use   Smoking  status: Never   Smokeless tobacco: Never  Vaping Use   Vaping status: Never Used  Substance and Sexual Activity   Alcohol use: Yes   Drug use: No   Sexual activity: Not Currently  Other Topics Concern   Not on file  Social History Narrative   Not on file   Social Drivers of Health   Financial Resource Strain: Not on file  Food Insecurity: Food Insecurity Present (05/24/2024)   Hunger Vital Sign    Worried About Running Out of Food in the Last Year: Often true    Ran Out of Food in the Last Year: Often true  Transportation Needs: Unmet  Transportation Needs (05/24/2024)   PRAPARE - Administrator, Civil Service (Medical): Yes    Lack of Transportation (Non-Medical): Yes  Physical Activity: Not on file  Stress: Not on file  Social Connections: Moderately Isolated (05/24/2024)   Social Connection and Isolation Panel    Frequency of Communication with Friends and Family: More than three times a week    Frequency of Social Gatherings with Friends and Family: More than three times a week    Attends Religious Services: 1 to 4 times per year    Active Member of [employer] or Organizations: No    Attends Banker Meetings: Never    Marital Status: Separated  Intimate Partner Violence: Not At Risk (05/24/2024)   Humiliation, Afraid, Rape, and Kick questionnaire    Fear of Current or Ex-Partner: No    Emotionally Abused: No    Physically Abused: No    Sexually Abused: No    Review of Systems  Constitutional: Negative.   HENT: Negative.    Eyes: Negative.   Respiratory:  Positive for shortness of breath and wheezing.   Cardiovascular: Negative.   Gastrointestinal: Negative.   Genitourinary: Negative.   Musculoskeletal: Negative.   Skin: Negative.   Neurological: Negative.   Endo/Heme/Allergies: Negative.   Psychiatric/Behavioral:  The patient is nervous/anxious and has insomnia.         Objective    BP (!) 167/90   Pulse 71   Ht 5' 4 (1.626 m)   Wt 149 lb 3.2 oz (67.7 kg)   SpO2 96%   BMI 25.61 kg/m   Physical Exam HENT:     Head: Normocephalic and atraumatic.     Nose: Nose normal.     Mouth/Throat:     Mouth: Mucous membranes are moist.     Pharynx: Oropharynx is clear.  Eyes:     Extraocular Movements: Extraocular movements intact.     Conjunctiva/sclera: Conjunctivae normal.     Pupils: Pupils are equal, round, and reactive to light.  Cardiovascular:     Rate and Rhythm: Normal rate and regular rhythm.     Pulses: Normal pulses.     Heart sounds: Normal heart sounds.   Pulmonary:     Effort: Pulmonary effort is normal.     Breath sounds: Normal breath sounds.  Abdominal:     General: Bowel sounds are normal.     Palpations: Abdomen is soft.  Genitourinary:    Comments: Deferred per patient Musculoskeletal:        General: Normal range of motion.     Cervical back: Normal range of motion.  Skin:    General: Skin is warm.     Capillary Refill: Capillary refill takes less than 2 seconds.  Neurological:     General: No focal deficit present.     Mental  Status: He is alert and oriented to person, place, and time.  Psychiatric:        Mood and Affect: Mood normal.        Behavior: Behavior normal.        Thought Content: Thought content normal.        Judgment: Judgment normal.         Assessment & Plan:   1. Encounter to establish care (Primary) - Routine labs will be checked - Ammonia; Future - Ethanol; Future - Urine drugs of abuse scrn w alc, routine ([facility] [place] [facility]); Future - Urinalysis; Future - CBC w/Diff; Future - Lipid panel; Future - HgB A1c; Future - Comp Met (CMET); Future - Comp Met (CMET) - HgB A1c - Lipid panel - CBC w/Diff - Urinalysis - Urine drugs of abuse scrn w alc, routine ([facility] [place] [facility]) - Ethanol - Ammonia  2. History of asthma - His breathing is stable, was started on Wixela and Albuterol  for rescue inhaler. He was educated on medication side effects and advised to notify clinic. - albuterol  (VENTOLIN  HFA) 108 (90 Base) MCG/ACT inhaler; Inhale 1-2 puffs into the lungs every 6 (six) hours as needed for wheezing or shortness of breath.  Dispense: 6.7 g; Refill: 3 - fluticasone -salmeterol (WIXELA INHUB) 100-50 MCG/ACT AEPB; Inhale 1 puff into the lungs 2 (two) times daily.  Dispense: 60 each; Refill: 0  3. Chronic obstructive pulmonary disease, unspecified COPD type (HCC) - His breathing is stable, was started on Wixela, educated on medication side effects, to rinse mouth  after each use, verbalized understanding. - albuterol  (VENTOLIN  HFA) 108 (90 Base) MCG/ACT inhaler; Inhale 1-2 puffs into the lungs every 6 (six) hours as needed for wheezing or shortness of breath.  Dispense: 6.7 g; Refill: 3 - fluticasone -salmeterol (WIXELA INHUB) 100-50 MCG/ACT AEPB; Inhale 1 puff into the lungs 2 (two) times daily.  Dispense: 60 each; Refill: 0   4. Essential hypertension - His blood pressure was elevated not under control. He was started on 50 mg Losartan daily, educated on medication side effects, advised to check blood pressure at least 3 times a week, record and bring log to follow up appointment. Continue on DASH diet and exercise as tolerated. - losartan (COZAAR) 50 MG tablet; Take 1 tablet (50 mg total) by mouth daily.  Dispense: 30 tablet; Refill: 0  5. History of anxiety - He was advised to call the Crisis help line with worsening symptoms. He will follow up with [facility] department [facility] for evaluation.    Return in about 23 days (around 06/16/2024), or if symptoms worsen or fail to improve.   Darbie Biancardi E Kyree Adriano, NP

## 2024-05-25 ENCOUNTER — Telehealth: Payer: Self-pay | Admitting: Gerontology

## 2024-05-25 ENCOUNTER — Other Ambulatory Visit: Payer: Self-pay

## 2024-05-25 ENCOUNTER — Inpatient Hospital Stay: Payer: Self-pay | Admitting: Family Medicine

## 2024-05-25 LAB — SPECIMEN STATUS REPORT

## 2024-05-25 NOTE — Telephone Encounter (Signed)
Called pt and left vm to call office back to reschedule missed HFU appt

## 2024-05-26 LAB — ETHANOL

## 2024-05-28 LAB — CBC WITH DIFFERENTIAL/PLATELET

## 2024-05-31 ENCOUNTER — Institutional Professional Consult (permissible substitution): Payer: Self-pay | Admitting: Licensed Clinical Social Worker

## 2024-06-01 LAB — URINE DRUGS OF ABUSE SCREEN W ALC, ROUTINE (REF LAB)
Amphetamines, Urine: NEGATIVE ng/mL
Barbiturate Quant, Ur: NEGATIVE ng/mL
Cannabinoid Quant, Ur: NEGATIVE ng/mL
Creatinine, Urine: 86.1 mg/dL (ref 20.0–300.0)
Ethanol, Urine: NEGATIVE %
Methadone Screen, Urine: NEGATIVE ng/mL
Nitrite Urine, Quantitative: NEGATIVE ug/mL
OPIATE SCREEN URINE: NEGATIVE ng/mL
PCP Quant, Ur: NEGATIVE ng/mL
Propoxyphene: NEGATIVE ng/mL
pH, Urine: 5.6 (ref 4.5–8.9)

## 2024-06-01 LAB — DRUG PROFILE 799031: BENZODIAZEPINES: NEGATIVE

## 2024-06-01 LAB — COCAINE CONF, UR
Benzoylecgonine GC/MS Conf: 9120 ng/mL
Cocaine Metab Quant, Ur: POSITIVE — AB

## 2024-06-02 LAB — URINALYSIS
Bilirubin, UA: NEGATIVE
Glucose, UA: NEGATIVE
Leukocytes,UA: NEGATIVE
Nitrite, UA: NEGATIVE
Protein,UA: NEGATIVE
RBC, UA: NEGATIVE
Specific Gravity, UA: 1.025 (ref 1.005–1.030)
Urobilinogen, Ur: 0.2 mg/dL (ref 0.2–1.0)
pH, UA: 5 (ref 5.0–7.5)

## 2024-06-02 LAB — CBC WITH DIFFERENTIAL/PLATELET

## 2024-06-02 LAB — LIPID PANEL
Chol/HDL Ratio: 3.4 ratio (ref 0.0–5.0)
Cholesterol, Total: 201 mg/dL — ABNORMAL HIGH (ref 100–199)
HDL: 59 mg/dL (ref >39)
LDL Chol Calc (NIH): 127 mg/dL — ABNORMAL HIGH (ref 0–99)
Triglycerides: 84 mg/dL (ref 0–149)
VLDL Cholesterol Cal: 15 mg/dL (ref 5–40)

## 2024-06-02 LAB — HEMOGLOBIN A1C
Est. average glucose Bld gHb Est-mCnc: 100 mg/dL
Hgb A1c MFr Bld: 5.1 % (ref 4.8–5.6)

## 2024-06-02 LAB — COMPREHENSIVE METABOLIC PANEL WITH GFR
ALT: 20 IU/L (ref 0–44)
AST: 33 IU/L (ref 0–40)
Albumin: 4 g/dL (ref 3.9–4.9)
Alkaline Phosphatase: 130 IU/L — ABNORMAL HIGH (ref 47–123)
BUN/Creatinine Ratio: 10 (ref 10–24)
BUN: 7 mg/dL — ABNORMAL LOW (ref 8–27)
Bilirubin Total: 0.4 mg/dL (ref 0.0–1.2)
CO2: 22 mmol/L (ref 20–29)
Calcium: 9.3 mg/dL (ref 8.6–10.2)
Chloride: 102 mmol/L (ref 96–106)
Creatinine, Ser: 0.69 mg/dL — ABNORMAL LOW (ref 0.76–1.27)
Globulin, Total: 3.2 g/dL (ref 1.5–4.5)
Glucose: 56 mg/dL — ABNORMAL LOW (ref 70–99)
Potassium: 4.4 mmol/L (ref 3.5–5.2)
Sodium: 137 mmol/L (ref 134–144)
Total Protein: 7.2 g/dL (ref 6.0–8.5)
eGFR: 105 mL/min/1.73 (ref >59)

## 2024-06-02 LAB — AMMONIA

## 2024-06-14 ENCOUNTER — Ambulatory Visit: Payer: Self-pay | Admitting: Gerontology

## 2024-06-16 ENCOUNTER — Ambulatory Visit: Payer: Self-pay | Admitting: Gerontology
